# Patient Record
Sex: Male | Born: 1970 | Race: Black or African American | Hispanic: No | Marital: Single | Smoking: Current every day smoker
Health system: Southern US, Community
[De-identification: ages and names within clinical notes are randomized; demographics above are authoritative.]

---

## 2000-01-01 ENCOUNTER — Emergency Department (HOSPITAL_COMMUNITY): Admission: EM | Admit: 2000-01-01 | Discharge: 2000-01-01 | Payer: Self-pay | Admitting: *Deleted

## 2001-11-05 ENCOUNTER — Emergency Department (HOSPITAL_COMMUNITY): Admission: EM | Admit: 2001-11-05 | Discharge: 2001-11-06 | Payer: Self-pay | Admitting: Emergency Medicine

## 2001-11-06 ENCOUNTER — Encounter: Payer: Self-pay | Admitting: Emergency Medicine

## 2003-04-02 ENCOUNTER — Emergency Department (HOSPITAL_COMMUNITY): Admission: EM | Admit: 2003-04-02 | Discharge: 2003-04-02 | Payer: Self-pay | Admitting: Emergency Medicine

## 2003-08-07 ENCOUNTER — Emergency Department (HOSPITAL_COMMUNITY): Admission: EM | Admit: 2003-08-07 | Discharge: 2003-08-07 | Payer: Self-pay | Admitting: Emergency Medicine

## 2003-10-02 ENCOUNTER — Emergency Department (HOSPITAL_COMMUNITY): Admission: EM | Admit: 2003-10-02 | Discharge: 2003-10-02 | Payer: Self-pay | Admitting: Emergency Medicine

## 2005-07-29 ENCOUNTER — Emergency Department (HOSPITAL_COMMUNITY): Admission: EM | Admit: 2005-07-29 | Discharge: 2005-07-29 | Payer: Self-pay | Admitting: Emergency Medicine

## 2012-09-16 ENCOUNTER — Encounter (HOSPITAL_COMMUNITY): Payer: Self-pay | Admitting: Emergency Medicine

## 2012-09-16 ENCOUNTER — Emergency Department (HOSPITAL_COMMUNITY)
Admission: EM | Admit: 2012-09-16 | Discharge: 2012-09-16 | Disposition: A | Discharge status: Home / Self Care | Payer: Self-pay | Attending: Emergency Medicine | Admitting: Emergency Medicine

## 2012-09-16 DIAGNOSIS — L03113 Cellulitis of right upper limb: Secondary | ICD-10-CM

## 2012-09-16 DIAGNOSIS — IMO0002 Reserved for concepts with insufficient information to code with codable children: Secondary | ICD-10-CM | POA: Insufficient documentation

## 2012-09-16 DIAGNOSIS — L03114 Cellulitis of left upper limb: Secondary | ICD-10-CM

## 2012-09-16 LAB — COMPREHENSIVE METABOLIC PANEL
ALT: 7 U/L (ref 0–53)
AST: 21 U/L (ref 0–37)
Albumin: 3.9 g/dL (ref 3.5–5.2)
Alkaline Phosphatase: 89 U/L (ref 39–117)
BUN: 17 mg/dL (ref 6–23)
CO2: 29 mEq/L (ref 19–32)
Calcium: 9.1 mg/dL (ref 8.4–10.5)
Chloride: 101 mEq/L (ref 96–112)
Creatinine, Ser: 1.26 mg/dL (ref 0.50–1.35)
GFR calc Af Amer: 81 mL/min — ABNORMAL LOW (ref >90)
GFR calc non Af Amer: 70 mL/min — ABNORMAL LOW (ref >90)
Glucose, Bld: 98 mg/dL (ref 70–99)
Potassium: 4.2 mEq/L (ref 3.5–5.1)
Sodium: 136 mEq/L (ref 135–145)
Total Bilirubin: 0.8 mg/dL (ref 0.3–1.2)
Total Protein: 7.5 g/dL (ref 6.0–8.3)

## 2012-09-16 LAB — CBC WITH DIFFERENTIAL/PLATELET
Basophils Absolute: 0 10*3/uL (ref 0.0–0.1)
Basophils Relative: 0 % (ref 0–1)
Eosinophils Absolute: 0.3 10*3/uL (ref 0.0–0.7)
Eosinophils Relative: 5 % (ref 0–5)
HCT: 36.5 % — ABNORMAL LOW (ref 39.0–52.0)
Hemoglobin: 12.7 g/dL — ABNORMAL LOW (ref 13.0–17.0)
Lymphocytes Relative: 27 % (ref 12–46)
Lymphs Abs: 1.7 10*3/uL (ref 0.7–4.0)
MCH: 29.2 pg (ref 26.0–34.0)
MCHC: 34.8 g/dL (ref 30.0–36.0)
MCV: 83.9 fL (ref 78.0–100.0)
Monocytes Absolute: 0.5 10*3/uL (ref 0.1–1.0)
Monocytes Relative: 8 % (ref 3–12)
Neutro Abs: 3.7 10*3/uL (ref 1.7–7.7)
Neutrophils Relative %: 60 % (ref 43–77)
Platelets: 206 10*3/uL (ref 150–400)
RBC: 4.35 MIL/uL (ref 4.22–5.81)
RDW: 14.7 % (ref 11.5–15.5)
WBC: 6.2 10*3/uL (ref 4.0–10.5)

## 2012-09-16 MED ORDER — CLINDAMYCIN HCL 150 MG PO CAPS: 300.0000 mg | ORAL_CAPSULE | Freq: Three times a day (TID) | ORAL | Status: DC

## 2012-09-16 MED ORDER — CLINDAMYCIN PHOSPHATE 900 MG/50ML IV SOLN
900.0000 mg | Freq: Once | INTRAVENOUS | Status: AC
  Administered 2012-09-16: 900 mg via INTRAVENOUS
  Filled 2012-09-16: qty 50

## 2012-09-16 MED ORDER — CIPROFLOXACIN IN D5W 400 MG/200ML IV SOLN
400.0000 mg | Freq: Once | INTRAVENOUS | Status: AC
  Administered 2012-09-16: 400 mg via INTRAVENOUS
  Filled 2012-09-16: qty 200

## 2012-09-16 MED ORDER — SODIUM CHLORIDE 0.9 % IV BOLUS (SEPSIS)
1000.0000 mL | Freq: Once | INTRAVENOUS | Status: AC
  Administered 2012-09-16: 1000 mL via INTRAVENOUS

## 2012-09-16 MED ORDER — CIPROFLOXACIN HCL 500 MG PO TABS: 500.0000 mg | ORAL_TABLET | Freq: Two times a day (BID) | ORAL | Status: DC

## 2012-09-16 NOTE — ED Notes (Signed)
Pt with multiple red swollen areas to bilateral arms with swollen draining wounds and generalized redness with some streaking; pt sts thinks from bug bites that initially were itching

## 2012-09-16 NOTE — ED Provider Notes (Signed)
History     CSN: [id number]  Arrival date & time 09/16/12  1543   First MD Initiated Contact with Patient 09/16/12 1633      Chief Complaint  Patient presents with  . Cellulitis     HPI  The patient presents with concern of multiple areas of erythema and pain was discharged on both arms. In 2 days ago, the patient awoke the day following doing yardwork.  He notes initially he had scattered, minimally raised punctate lesions.  Over the interval the lesions have enlarged, been productive of of clear fluid, and developed erythema that is spreading.  He denies concurrent fevers, nausea, vomiting, dyspnea, chest pain, other complaints. Minimal relief with topical hydrocortisone.  the patient takes no medication, is generally well.   History reviewed. No pertinent past medical history.  History reviewed. No pertinent past surgical history.  History reviewed. No pertinent family history.  History  Substance Use Topics  . Smoking status: Never Smoker   . Smokeless tobacco: Not on file  . Alcohol Use: Yes     Comment: occ      Review of Systems  All other systems reviewed and are negative.    Allergies  Review of patient's allergies indicates no known allergies.  Home Medications   Current Outpatient Rx  Name  Route  Sig  Dispense  Refill  . hydrocortisone 2.5 % cream   Topical   Apply 1 application topically 2 (two) times daily as needed (for itching).           BP 129/74  Pulse 82  Temp(Src) 98.4 F (36.9 C) (Oral)  Resp 18  SpO2 98%  Physical Exam  Nursing note and vitals reviewed. Constitutional: He is oriented to person, place, and time. He appears well-developed. No distress.  HENT:  Head: Normocephalic and atraumatic.  Eyes: Conjunctivae and EOM are normal.  Cardiovascular: Normal rate and regular rhythm.   Pulmonary/Chest: Effort normal. No stridor. No respiratory distress.  Abdominal: He exhibits no distension.  Musculoskeletal: He exhibits no  edema.  Neurological: He is alert and oriented to person, place, and time.  Skin: Skin is warm and dry.  On each arm there are numerous mildly raised areas of ulcerated lesion with clear drainage, surrounding erythema, confluent in both forearms.  In the proximal arms there is small erythema, primarily about the medial aspect of the right upper arm.   Psychiatric: He has a normal mood and affect.    ED Course  Procedures (including critical care time)  Labs Reviewed  CBC WITH DIFFERENTIAL - Abnormal; Notable for the following:    Hemoglobin 12.7 (*)    HCT 36.5 (*)    All other components within normal limits  COMPREHENSIVE METABOLIC PANEL   No results found.   No diagnosis found.  The patient prefers a trial of IV antibiotics, observation, discharged to follow up with her primary care physician if he remains well.   7:24 PM Wounds appear better, with decreasing erythema in confluence. MDM  This patient presents with grossly infected bilateral arm wounds concerning for cellulitis.  On exam the patient is afebrile, otherwise in no distress, and generally healthy. After initial provision of antibiotics, the patient's wound improved, and he was observed here for several hours to ensure improvement.  Patient preferred discharge, do to work obligations.  This was accommodated given his capacity to make this request, the improvement following IV antibiotics, and absence of distress or abnormal vital signs.  Explicit return precautions provided.  Gerhard Munch, MD 09/16/12 1925

## 2013-01-07 ENCOUNTER — Encounter (HOSPITAL_COMMUNITY): Payer: Self-pay | Admitting: Emergency Medicine

## 2013-01-07 ENCOUNTER — Emergency Department (HOSPITAL_COMMUNITY)
Admission: EM | Admit: 2013-01-07 | Discharge: 2013-01-07 | Disposition: A | Discharge status: Home / Self Care | Payer: Self-pay | Attending: Emergency Medicine | Admitting: Emergency Medicine

## 2013-01-07 DIAGNOSIS — R599 Enlarged lymph nodes, unspecified: Secondary | ICD-10-CM | POA: Insufficient documentation

## 2013-01-07 DIAGNOSIS — R590 Localized enlarged lymph nodes: Secondary | ICD-10-CM

## 2013-01-07 DIAGNOSIS — R109 Unspecified abdominal pain: Secondary | ICD-10-CM | POA: Insufficient documentation

## 2013-01-07 DIAGNOSIS — R1909 Other intra-abdominal and pelvic swelling, mass and lump: Secondary | ICD-10-CM | POA: Insufficient documentation

## 2013-01-07 MED ORDER — IBUPROFEN 600 MG PO TABS: 600.0000 mg | ORAL_TABLET | Freq: Three times a day (TID) | ORAL | Status: DC | PRN

## 2013-01-07 NOTE — ED Notes (Signed)
Pt sts gland in left groin area swollen like was when had abscess to arm; pt poor historian; pt sts treated with antibiotics and sts does not feel right

## 2013-01-07 NOTE — ED Notes (Signed)
Patient states that he has "been having bumps pop up and then they go away.   I feel just like I did when I had cellulitis.   I might need to get antibiotics again."

## 2013-01-07 NOTE — ED Provider Notes (Signed)
CSN: [id number]     Arrival date & time 01/07/13  1452 History  This chart was scribed for Felicie Morn, NP, working with Candyce Churn, MD by Blanchard Kelch, ED Scribe. This patient was seen in room TR06C/TR06C and the patient's care was started at 3:39 PM.    Chief Complaint  Patient presents with  . Lymphadenopathy    Patient is a 42 y.o. male presenting with general illness. The history is provided by the patient. No language interpreter was used.  Illness Location:  Left inguinal area Quality:  Swelling Severity:  Mild Onset quality:  Gradual Duration:  3 weeks Timing:  Intermittent Progression:  Worsening Chronicity:  New Associated symptoms: abdominal pain   Associated symptoms: no fever and no nausea     HPI Comments: Bruce Steele is a 42 y.o. male who presents to the Emergency Department complaining of constant, gradually worsening swelling of left inguinal area that began a few weeks ago. Patient complains of assocatied intermittent, gradually worsening abdominal cramping as well as non-draining bumps to arms and right upper lateral thigh. Patient denies taking any medications for the pain.  Patient denies fever, nausea wounds, testicular/penile pain, or penile drainage. Patient reports no pertinent past medical history. He is currently sexually active.   Pt denies having a PCP currently.  History  Substance Use Topics  . Smoking status: Never Smoker   . Smokeless tobacco: Not on file  . Alcohol Use: Yes     Comment: occ    Review of Systems  Constitutional: Negative for fever.  Gastrointestinal: Positive for abdominal pain. Negative for nausea.  Genitourinary: Negative for discharge, penile pain and testicular pain.  Skin: Negative for wound.  All other systems reviewed and are negative.    Allergies  Review of patient's allergies indicates no known allergies.  Home Medications  No current outpatient prescriptions on file.  Triage Vitals: BP 133/68   Pulse 73  Temp(Src) 98.5 F (36.9 C) (Oral)  Resp 16  Wt 158 lb 12.8 oz (72.031 kg)  SpO2 96%  Physical Exam  Nursing note and vitals reviewed. Constitutional: He is oriented to person, place, and time. He appears well-developed and well-nourished. No distress.  HENT:  Head: Normocephalic and atraumatic.  Eyes: EOM are normal.  Neck: Neck supple. No tracheal deviation present.  Cardiovascular: Normal rate and regular rhythm.   Pulmonary/Chest: Effort normal and breath sounds normal. No respiratory distress.  Musculoskeletal: Normal range of motion.  Lymphadenopathy:       Left: Inguinal (slight) adenopathy present.  Neurological: He is alert and oriented to person, place, and time.  Skin: Skin is warm and dry.  Psychiatric: He has a normal mood and affect. His behavior is normal.    ED Course  Procedures (including critical care time)  DIAGNOSTIC STUDIES:  Oxygen Saturation is 96% on room air, adequate by my interpretation.    COORDINATION OF CARE:  3:50 PM -Recommend anti-inflammatory medication for pain and swelling and with follow up to a PCP if symptoms worsen. Patient verbalizes understanding and agrees with treatment plan.      Labs Review Labs Reviewed - No data to display Imaging Review No results found.  Mild tenderness to left inguinal lymph node.  Small shotty nodes palpated.  No clear indication of infectious process. No wounds to legs.  No testicular pain, dysuria, penile discharge.  Area not warm or red over the nodes.  Patient without fever.  Discussed return precautions.  Resource information provided for locating  primary care. MDM   Left inguinal lymphadenopathy, no identified infectious cause.  I personally performed the services described in this documentation, which was scribed in my presence. The recorded information has been reviewed and is accurate.    Jimmye Norman, NP 01/07/13 1751

## 2013-01-07 NOTE — ED Notes (Signed)
Discharge instructions reviewed with pt. Pt verbalized understanding.   

## 2013-01-08 NOTE — ED Provider Notes (Signed)
Medical screening examination/treatment/procedure(s) were performed by non-physician practitioner and as supervising physician I was immediately available for consultation/collaboration.   Candyce Churn, MD 01/08/13 [phone]

## 2014-03-05 ENCOUNTER — Emergency Department (HOSPITAL_COMMUNITY)
Admission: EM | Admit: 2014-03-05 | Discharge: 2014-03-06 | Disposition: A | Discharge status: Home / Self Care | Payer: Self-pay | Attending: Emergency Medicine | Admitting: Emergency Medicine

## 2014-03-05 ENCOUNTER — Encounter (HOSPITAL_COMMUNITY): Payer: Self-pay | Admitting: Emergency Medicine

## 2014-03-05 DIAGNOSIS — R197 Diarrhea, unspecified: Secondary | ICD-10-CM | POA: Insufficient documentation

## 2014-03-05 DIAGNOSIS — R109 Unspecified abdominal pain: Secondary | ICD-10-CM | POA: Insufficient documentation

## 2014-03-05 DIAGNOSIS — Z7982 Long term (current) use of aspirin: Secondary | ICD-10-CM | POA: Insufficient documentation

## 2014-03-05 LAB — COMPREHENSIVE METABOLIC PANEL
ALT: 11 U/L (ref 0–53)
AST: 18 U/L (ref 0–37)
Albumin: 3.9 g/dL (ref 3.5–5.2)
Alkaline Phosphatase: 108 U/L (ref 39–117)
Anion gap: 12 (ref 5–15)
BUN: 11 mg/dL (ref 6–23)
CO2: 25 mEq/L (ref 19–32)
Calcium: 9.5 mg/dL (ref 8.4–10.5)
Chloride: 101 mEq/L (ref 96–112)
Creatinine, Ser: 1.03 mg/dL (ref 0.50–1.35)
GFR calc Af Amer: >90 mL/min (ref >90)
GFR calc non Af Amer: 89 mL/min — ABNORMAL LOW (ref >90)
Glucose, Bld: 77 mg/dL (ref 70–99)
Potassium: 4.1 mEq/L (ref 3.7–5.3)
Sodium: 138 mEq/L (ref 137–147)
Total Bilirubin: 0.4 mg/dL (ref 0.3–1.2)
Total Protein: 7.9 g/dL (ref 6.0–8.3)

## 2014-03-05 LAB — CBC WITH DIFFERENTIAL/PLATELET
Basophils Absolute: 0 10*3/uL (ref 0.0–0.1)
Basophils Relative: 0 % (ref 0–1)
Eosinophils Absolute: 0.3 10*3/uL (ref 0.0–0.7)
Eosinophils Relative: 7 % — ABNORMAL HIGH (ref 0–5)
HCT: 37.3 % — ABNORMAL LOW (ref 39.0–52.0)
Hemoglobin: 12.8 g/dL — ABNORMAL LOW (ref 13.0–17.0)
Lymphocytes Relative: 55 % — ABNORMAL HIGH (ref 12–46)
Lymphs Abs: 2.9 10*3/uL (ref 0.7–4.0)
MCH: 28.7 pg (ref 26.0–34.0)
MCHC: 34.3 g/dL (ref 30.0–36.0)
MCV: 83.6 fL (ref 78.0–100.0)
Monocytes Absolute: 0.6 10*3/uL (ref 0.1–1.0)
Monocytes Relative: 12 % (ref 3–12)
Neutro Abs: 1.4 10*3/uL — ABNORMAL LOW (ref 1.7–7.7)
Neutrophils Relative %: 26 % — ABNORMAL LOW (ref 43–77)
Platelets: 214 10*3/uL (ref 150–400)
RBC: 4.46 MIL/uL (ref 4.22–5.81)
RDW: 14.8 % (ref 11.5–15.5)
WBC: 5.2 10*3/uL (ref 4.0–10.5)

## 2014-03-05 LAB — LIPASE, BLOOD: Lipase: 48 U/L (ref 11–59)

## 2014-03-05 NOTE — ED Notes (Signed)
Pt. reports intermittent pain across abdomen and left lower back pain for several weeks , denies nausea or vomitting , no urinary discomfort , occasional diarrhea.

## 2014-03-06 LAB — URINALYSIS, ROUTINE W REFLEX MICROSCOPIC
Bilirubin Urine: NEGATIVE
Glucose, UA: NEGATIVE mg/dL
Hgb urine dipstick: NEGATIVE
Ketones, ur: NEGATIVE mg/dL
Leukocytes, UA: NEGATIVE
Nitrite: NEGATIVE
Protein, ur: NEGATIVE mg/dL
Specific Gravity, Urine: 1.005 (ref 1.005–1.030)
Urobilinogen, UA: 0.2 mg/dL (ref 0.0–1.0)
pH: 6 (ref 5.0–8.0)

## 2014-03-06 NOTE — ED Provider Notes (Signed)
CSN: [id number]     Arrival date & time 03/05/14  2020 History   First MD Initiated Contact with Patient 03/05/14 2323     Chief Complaint  Patient presents with  . Abdominal Pain     (Consider location/radiation/quality/duration/timing/severity/associated sxs/prior Treatment) HPI Comments: 43 year-old male with no significant medical or surgery history presents with intermittent lower abdominal pain and flank pain past 2 weeks. Mild diarrhea no blood. Currently no abdominal pain. No fevers chills or vomiting. Patient recently had the death of his brother and family has been stressed coping with significant stress.  Patient is a 43 y.o. male presenting with abdominal pain. The history is provided by the patient.  Abdominal Pain Associated symptoms: diarrhea   Associated symptoms: no chest pain, no chills, no dysuria, no fever, no nausea, no shortness of breath and no vomiting     History reviewed. No pertinent past medical history. History reviewed. No pertinent past surgical history. No family history on file. History  Substance Use Topics  . Smoking status: Never Smoker   . Smokeless tobacco: Not on file  . Alcohol Use: Yes     Comment: occ    Review of Systems  Constitutional: Negative for fever and chills.  HENT: Negative for congestion.   Eyes: Negative for visual disturbance.  Respiratory: Negative for shortness of breath.   Cardiovascular: Negative for chest pain.  Gastrointestinal: Positive for abdominal pain and diarrhea. Negative for nausea and vomiting.  Genitourinary: Positive for flank pain (intermittent). Negative for dysuria.  Musculoskeletal: Negative for back pain, neck pain and neck stiffness.  Skin: Negative for rash.  Neurological: Negative for light-headedness and headaches.      Allergies  Review of patient's allergies indicates no known allergies.  Home Medications   Prior to Admission medications   Medication Sig Start Date End Date Taking?  Authorizing Provider  aspirin 325 MG tablet Take 325 mg by mouth once.   Yes Historical Provider, MD   BP 109/79  Pulse 62  Temp(Src) 98.5 F (36.9 C) (Oral)  Resp 16  Ht 5\' 9"  (1.753 m)  Wt 158 lb (71.668 kg)  BMI 23.32 kg/m2  SpO2 100% Physical Exam  Nursing note and vitals reviewed. Constitutional: He is oriented to person, place, and time. He appears well-developed and well-nourished.  HENT:  Head: Normocephalic and atraumatic.  Eyes: Conjunctivae are normal. Right eye exhibits no discharge. Left eye exhibits no discharge.  Neck: Normal range of motion. Neck supple. No tracheal deviation present.  Cardiovascular: Normal rate and regular rhythm.   Pulmonary/Chest: Effort normal and breath sounds normal.  Abdominal: Soft. He exhibits no distension. There is no tenderness. There is no guarding.  Musculoskeletal: He exhibits no edema.  Neurological: He is alert and oriented to person, place, and time.  Skin: Skin is warm. No rash noted.  Psychiatric: He has a normal mood and affect.    ED Course  Procedures (including critical care time) Labs Review Labs Reviewed  CBC WITH DIFFERENTIAL - Abnormal; Notable for the following:    Hemoglobin 12.8 (*)    HCT 37.3 (*)    Neutrophils Relative % 26 (*)    Neutro Abs 1.4 (*)    Lymphocytes Relative 55 (*)    Eosinophils Relative 7 (*)    All other components within normal limits  COMPREHENSIVE METABOLIC PANEL - Abnormal; Notable for the following:    GFR calc non Af Amer 89 (*)    All other components within normal limits  LIPASE, BLOOD  URINALYSIS, ROUTINE W REFLEX MICROSCOPIC    Imaging Review No results found.   EKG Interpretation None      MDM   Final diagnoses:  Diarrhea  Abdominal cramping   Patient with no significant medical history, currently no abdominal or flank pain. Discussed monitoring symptoms closely, improving diet and watching if symptoms improve as family stress improves. Benign abdominal exam.  Urinalysis pending. No indication for CT scan this time.  Results and differential diagnosis were discussed with the patient/parent/guardian. Close follow up outpatient was discussed, comfortable with the plan.   Medications - No data to display  Filed Vitals:   03/05/14 2055 03/05/14 2250 03/05/14 2337  BP: 124/78 113/60 109/79  Pulse: 62 55 62  Temp: 98.5 F (36.9 C) 98.5 F (36.9 C)   TempSrc: Oral Oral   Resp: 14 16 16   Height: 5\' 9"  (1.753 m)    Weight: 158 lb (71.668 kg)    SpO2: 100% 99% 100%    Final diagnoses:  Diarrhea  Abdominal cramping        Enid SkeensJoshua M Daliyah Sramek, MD 03/06/14 [phone]

## 2014-03-06 NOTE — Discharge Instructions (Signed)
If you were given medicines take as directed.  If you are on coumadin or contraceptives realize their levels and effectiveness is altered by many different medicines.  If you have any reaction (rash, tongues swelling, other) to the medicines stop taking and see a physician.   Please follow up as directed and return to the ER or see a physician for new or worsening symptoms.  Thank you. Filed Vitals:   03/05/14 2055 03/05/14 2250 03/05/14 2337  BP: 124/78 113/60 109/79  Pulse: 62 55 62  Temp: 98.5 F (36.9 C) 98.5 F (36.9 C)   TempSrc: Oral Oral   Resp: 14 16 16   Height: 5\' 9"  (1.753 m)    Weight: 158 lb (71.668 kg)    SpO2: 100% 99% 100%

## 2014-03-25 ENCOUNTER — Encounter (HOSPITAL_COMMUNITY): Payer: Self-pay | Admitting: Cardiology

## 2014-03-25 ENCOUNTER — Emergency Department (HOSPITAL_COMMUNITY)
Admission: EM | Admit: 2014-03-25 | Discharge: 2014-03-25 | Disposition: A | Discharge status: Home / Self Care | Payer: Self-pay | Attending: Emergency Medicine | Admitting: Emergency Medicine

## 2014-03-25 DIAGNOSIS — R51 Headache: Secondary | ICD-10-CM | POA: Insufficient documentation

## 2014-03-25 DIAGNOSIS — R519 Headache, unspecified: Secondary | ICD-10-CM

## 2014-03-25 DIAGNOSIS — Z720 Tobacco use: Secondary | ICD-10-CM | POA: Insufficient documentation

## 2014-03-25 DIAGNOSIS — R197 Diarrhea, unspecified: Secondary | ICD-10-CM | POA: Insufficient documentation

## 2014-03-25 DIAGNOSIS — R109 Unspecified abdominal pain: Secondary | ICD-10-CM | POA: Insufficient documentation

## 2014-03-25 LAB — CBC WITH DIFFERENTIAL/PLATELET
Basophils Absolute: 0 10*3/uL (ref 0.0–0.1)
Basophils Relative: 0 % (ref 0–1)
Eosinophils Absolute: 0.2 10*3/uL (ref 0.0–0.7)
Eosinophils Relative: 5 % (ref 0–5)
HCT: 39.6 % (ref 39.0–52.0)
Hemoglobin: 13.2 g/dL (ref 13.0–17.0)
Lymphocytes Relative: 51 % — ABNORMAL HIGH (ref 12–46)
Lymphs Abs: 2.1 10*3/uL (ref 0.7–4.0)
MCH: 29.1 pg (ref 26.0–34.0)
MCHC: 33.3 g/dL (ref 30.0–36.0)
MCV: 87.2 fL (ref 78.0–100.0)
Monocytes Absolute: 0.5 10*3/uL (ref 0.1–1.0)
Monocytes Relative: 11 % (ref 3–12)
Neutro Abs: 1.4 10*3/uL — ABNORMAL LOW (ref 1.7–7.7)
Neutrophils Relative %: 33 % — ABNORMAL LOW (ref 43–77)
Platelets: 232 10*3/uL (ref 150–400)
RBC: 4.54 MIL/uL (ref 4.22–5.81)
RDW: 15.7 % — ABNORMAL HIGH (ref 11.5–15.5)
WBC: 4.2 10*3/uL (ref 4.0–10.5)

## 2014-03-25 LAB — COMPREHENSIVE METABOLIC PANEL
ALT: 13 U/L (ref 0–53)
AST: 22 U/L (ref 0–37)
Albumin: 4 g/dL (ref 3.5–5.2)
Alkaline Phosphatase: 102 U/L (ref 39–117)
Anion gap: 13 (ref 5–15)
BUN: 10 mg/dL (ref 6–23)
CO2: 24 mEq/L (ref 19–32)
Calcium: 9.3 mg/dL (ref 8.4–10.5)
Chloride: 103 mEq/L (ref 96–112)
Creatinine, Ser: 1.08 mg/dL (ref 0.50–1.35)
GFR calc Af Amer: >90 mL/min (ref >90)
GFR calc non Af Amer: 84 mL/min — ABNORMAL LOW (ref >90)
Glucose, Bld: 93 mg/dL (ref 70–99)
Potassium: 4.2 mEq/L (ref 3.7–5.3)
Sodium: 140 mEq/L (ref 137–147)
Total Bilirubin: 1 mg/dL (ref 0.3–1.2)
Total Protein: 7.7 g/dL (ref 6.0–8.3)

## 2014-03-25 LAB — URINALYSIS, ROUTINE W REFLEX MICROSCOPIC
Bilirubin Urine: NEGATIVE
Glucose, UA: NEGATIVE mg/dL
Hgb urine dipstick: NEGATIVE
Ketones, ur: NEGATIVE mg/dL
Leukocytes, UA: NEGATIVE
Nitrite: NEGATIVE
Protein, ur: NEGATIVE mg/dL
Specific Gravity, Urine: 1.022 (ref 1.005–1.030)
Urobilinogen, UA: 1 mg/dL (ref 0.0–1.0)
pH: 6 (ref 5.0–8.0)

## 2014-03-25 LAB — LIPASE, BLOOD: Lipase: 34 U/L (ref 11–59)

## 2014-03-25 NOTE — ED Provider Notes (Signed)
CSN: [id number]     Arrival date & time 03/25/14  1136 History   First MD Initiated Contact with Patient 03/25/14 1505     Chief Complaint  Patient presents with  . Abdominal Pain  . Headache     (Consider location/radiation/quality/duration/timing/severity/associated sxs/prior Treatment) HPI Comments: This is a 43 y/o male with no significant past medical history who presents to the emergency department requesting the number that he was given on his discharge papers when he was seen on October 26. It is noted he was referred to the wellness clinic, however no longer has the paperwork and cannot find the number. He states he was seen for abdominal pain at that time, which has been coming and going since. Pain located along his left side, intermittent, nothing in specific making the pain come or go. Pain is cramping and nonradiating. Admits to a few episodes of nonbloody diarrhea. No nausea or vomiting. He also reports over the past 2 weeks he has been experiencing generalized headaches that are relieved by Tylenol. Currently he is pain-free. Denies fever, chills, neck pain, vision changes, nausea, vomiting, photophobia or phonophobia. He states prior to onset of headaches, his brother passed away, and since then he has been experiencing very vivid dreams. He states he is experiencing very real life dreams and then getting dja vu. Denies auditory or visual hallucinations.  Patient is a 43 y.o. male presenting with abdominal pain and headaches. The history is provided by the patient.  Abdominal Pain Associated symptoms: diarrhea   Headache Associated symptoms: abdominal pain and diarrhea     History reviewed. No pertinent past medical history. History reviewed. No pertinent past surgical history. History reviewed. No pertinent family history. History  Substance Use Topics  . Smoking status: Current Every Day Smoker    Types: Cigars  . Smokeless tobacco: Not on file  . Alcohol Use: Yes    Comment: occ    Review of Systems  Gastrointestinal: Positive for abdominal pain and diarrhea.  Neurological: Positive for headaches.  All other systems reviewed and are negative.     Allergies  Review of patient's allergies indicates no known allergies.  Home Medications   Prior to Admission medications   Medication Sig Start Date End Date Taking? Authorizing Provider  aspirin 325 MG tablet Take 325 mg by mouth once.    Historical Provider, MD   BP 114/65 mmHg  Pulse 55  Temp(Src) 97.9 F (36.6 C) (Oral)  Resp 16  SpO2 100% Physical Exam  Constitutional: He is oriented to person, place, and time. He appears well-developed and well-nourished. No distress.  HENT:  Head: Normocephalic and atraumatic.  Mouth/Throat: Oropharynx is clear and moist.  Eyes: Conjunctivae and EOM are normal. Pupils are equal, round, and reactive to light.  Neck: Normal range of motion. Neck supple. No JVD present.  Cardiovascular: Normal rate, regular rhythm, normal heart sounds and intact distal pulses.   No extremity edema.  Pulmonary/Chest: Effort normal and breath sounds normal. No respiratory distress.  Abdominal: Soft. Bowel sounds are normal. He exhibits no distension and no mass. There is no tenderness. There is no rebound and no guarding.  Musculoskeletal: Normal range of motion. He exhibits no edema.  Neurological: He is alert and oriented to person, place, and time. He has normal strength. No cranial nerve deficit or sensory deficit. He displays a negative Romberg sign. Coordination normal.  Speech fluent, goal oriented. Moves limbs without ataxia. Equal grip strength bilateral.  Skin: Skin is warm and  dry. He is not diaphoretic.  Psychiatric: He has a normal mood and affect. His behavior is normal.  Nursing note and vitals reviewed.   ED Course  Procedures (including critical care time) Labs Review Labs Reviewed  CBC WITH DIFFERENTIAL - Abnormal; Notable for the following:     RDW 15.7 (*)    Neutrophils Relative % 33 (*)    Neutro Abs 1.4 (*)    Lymphocytes Relative 51 (*)    All other components within normal limits  COMPREHENSIVE METABOLIC PANEL - Abnormal; Notable for the following:    GFR calc non Af Amer 84 (*)    All other components within normal limits  LIPASE, BLOOD  URINALYSIS, ROUTINE W REFLEX MICROSCOPIC    Imaging Review No results found.   EKG Interpretation None      MDM   Final diagnoses:  Left sided abdominal pain  Generalized headaches   Patient requesting the number that he was given at prior visit. He is in no apparent distress. Asymptomatic in the emergency department. Afebrile, vital signs stable. Abdomen is soft and nontender. Unremarkable neurologic exam. No hallucinations. His vivid dreams may be an aspect of the stress reaction after his brother passed away. Patient will follow-up with the wellness clinic. He is stable for discharge. Return precautions given. Patient states understanding of treatment care plan and is agreeable.  Kathrynn SpeedRobyn M Darryel Diodato, PA-C 03/25/14 1552  Audree CamelScott T Goldston, MD 03/26/14 [id number]

## 2014-03-25 NOTE — ED Notes (Signed)
Patient states he sees things before they happen. Patient states, " Its like I could see yalls names on the board and I could see the guy getting my blood'.

## 2014-03-25 NOTE — ED Notes (Signed)
Pt reports he has had abd pain for a couple of weeks and was seen for that here, but pain has continued. Reports he is also having headaches and pain between his testicles and rectum.

## 2014-03-25 NOTE — Discharge Instructions (Signed)
Headaches, Frequently Asked Questions °MIGRAINE HEADACHES °Q: What is migraine? What causes it? How can I treat it? °A: Generally, migraine headaches begin as a dull ache. Then they develop into a constant, throbbing, and pulsating pain. You may experience pain at the temples. You may experience pain at the front or back of one or both sides of the head. The pain is usually accompanied by a combination of: °· Nausea. °· Vomiting. °· Sensitivity to light and noise. °Some people (about 15%) experience an aura (see below) before an attack. The cause of migraine is believed to be chemical reactions in the brain. Treatment for migraine may include over-the-counter or prescription medications. It may also include self-help techniques. These include relaxation training and biofeedback.  °Q: What is an aura? °A: About 15% of people with migraine get an "aura". This is a sign of neurological symptoms that occur before a migraine headache. You may see wavy or jagged lines, dots, or flashing lights. You might experience tunnel vision or blind spots in one or both eyes. The aura can include visual or auditory hallucinations (something imagined). It may include disruptions in smell (such as strange odors), taste or touch. Other symptoms include: °· Numbness. °· A "pins and needles" sensation. °· Difficulty in recalling or speaking the correct word. °These neurological events may last as long as 60 minutes. These symptoms will fade as the headache begins. °Q: What is a trigger? °A: Certain physical or environmental factors can lead to or "trigger" a migraine. These include: °· Foods. °· Hormonal changes. °· Weather. °· Stress. °It is important to remember that triggers are different for everyone. To help prevent migraine attacks, you need to figure out which triggers affect you. Keep a headache diary. This is a good way to track triggers. The diary will help you talk to your healthcare professional about your condition. °Q: Does  weather affect migraines? °A: Bright sunshine, hot, humid conditions, and drastic changes in barometric pressure may lead to, or "trigger," a migraine attack in some people. But studies have shown that weather does not act as a trigger for everyone with migraines. °Q: What is the link between migraine and hormones? °A: Hormones start and regulate many of your body's functions. Hormones keep your body in balance within a constantly changing environment. The levels of hormones in your body are unbalanced at times. Examples are during menstruation, pregnancy, or menopause. That can lead to a migraine attack. In fact, about three quarters of all women with migraine report that their attacks are related to the menstrual cycle.  °Q: Is there an increased risk of stroke for migraine sufferers? °A: The likelihood of a migraine attack causing a stroke is very remote. That is not to say that migraine sufferers cannot have a stroke associated with their migraines. In persons under age 40, the most common associated factor for stroke is migraine headache. But over the course of a person's normal life span, the occurrence of migraine headache may actually be associated with a reduced risk of dying from cerebrovascular disease due to stroke.  °Q: What are acute medications for migraine? °A: Acute medications are used to treat the pain of the headache after it has started. Examples over-the-counter medications, NSAIDs, ergots, and triptans.  °Q: What are the triptans? °A: Triptans are the newest class of abortive medications. They are specifically targeted to treat migraine. Triptans are vasoconstrictors. They moderate some chemical reactions in the brain. The triptans work on receptors in your brain. Triptans help   to restore the balance of a neurotransmitter called serotonin. Fluctuations in levels of serotonin are thought to be a main cause of migraine.  °Q: Are over-the-counter medications for migraine effective? °A:  Over-the-counter, or "OTC," medications may be effective in relieving mild to moderate pain and associated symptoms of migraine. But you should see your caregiver before beginning any treatment regimen for migraine.  °Q: What are preventive medications for migraine? °A: Preventive medications for migraine are sometimes referred to as "prophylactic" treatments. They are used to reduce the frequency, severity, and length of migraine attacks. Examples of preventive medications include antiepileptic medications, antidepressants, beta-blockers, calcium channel blockers, and NSAIDs (nonsteroidal anti-inflammatory drugs). °Q: Why are anticonvulsants used to treat migraine? °A: During the past few years, there has been an increased interest in antiepileptic drugs for the prevention of migraine. They are sometimes referred to as "anticonvulsants". Both epilepsy and migraine may be caused by similar reactions in the brain.  °Q: Why are antidepressants used to treat migraine? °A: Antidepressants are typically used to treat people with depression. They may reduce migraine frequency by regulating chemical levels, such as serotonin, in the brain.  °Q: What alternative therapies are used to treat migraine? °A: The term "alternative therapies" is often used to describe treatments considered outside the scope of conventional Western medicine. Examples of alternative therapy include acupuncture, acupressure, and yoga. Another common alternative treatment is herbal therapy. Some herbs are believed to relieve headache pain. Always discuss alternative therapies with your caregiver before proceeding. Some herbal products contain arsenic and other toxins. °TENSION HEADACHES °Q: What is a tension-type headache? What causes it? How can I treat it? °A: Tension-type headaches occur randomly. They are often the result of temporary stress, anxiety, fatigue, or anger. Symptoms include soreness in your temples, a tightening band-like sensation  around your head (a "vice-like" ache). Symptoms can also include a pulling feeling, pressure sensations, and contracting head and neck muscles. The headache begins in your forehead, temples, or the back of your head and neck. Treatment for tension-type headache may include over-the-counter or prescription medications. Treatment may also include self-help techniques such as relaxation training and biofeedback. °CLUSTER HEADACHES °Q: What is a cluster headache? What causes it? How can I treat it? °A: Cluster headache gets its name because the attacks come in groups. The pain arrives with little, if any, warning. It is usually on one side of the head. A tearing or bloodshot eye and a runny nose on the same side of the headache may also accompany the pain. Cluster headaches are believed to be caused by chemical reactions in the brain. They have been described as the most severe and intense of any headache type. Treatment for cluster headache includes prescription medication and oxygen. °SINUS HEADACHES °Q: What is a sinus headache? What causes it? How can I treat it? °A: When a cavity in the bones of the face and skull (a sinus) becomes inflamed, the inflammation will cause localized pain. This condition is usually the result of an allergic reaction, a tumor, or an infection. If your headache is caused by a sinus blockage, such as an infection, you will probably have a fever. An x-ray will confirm a sinus blockage. Your caregiver's treatment might include antibiotics for the infection, as well as antihistamines or decongestants.  °REBOUND HEADACHES °Q: What is a rebound headache? What causes it? How can I treat it? °A: A pattern of taking acute headache medications too often can lead to a condition known as "rebound headache."   A pattern of taking too much headache medication includes taking it more than 2 days per week or in excessive amounts. That means more than the label or a caregiver advises. With rebound  headaches, your medications not only stop relieving pain, they actually begin to cause headaches. Doctors treat rebound headache by tapering the medication that is being overused. Sometimes your caregiver will gradually substitute a different type of treatment or medication. Stopping may be a challenge. Regularly overusing a medication increases the potential for serious side effects. Consult a caregiver if you regularly use headache medications more than 2 days per week or more than the label advises. ADDITIONAL QUESTIONS AND ANSWERS Q: What is biofeedback? A: Biofeedback is a self-help treatment. Biofeedback uses special equipment to monitor your body's involuntary physical responses. Biofeedback monitors:  Breathing.  Pulse.  Heart rate.  Temperature.  Muscle tension.  Brain activity. Biofeedback helps you refine and perfect your relaxation exercises. You learn to control the physical responses that are related to stress. Once the technique has been mastered, you do not need the equipment any more. Q: Are headaches hereditary? A: Four out of five (80%) of people that suffer report a family history of migraine. Scientists are not sure if this is genetic or a family predisposition. Despite the uncertainty, a child has a 50% chance of having migraine if one parent suffers. The child has a 75% chance if both parents suffer.  Q: Can children get headaches? A: By the time they reach high school, most young people have experienced some type of headache. Many safe and effective approaches or medications can prevent a headache from occurring or stop it after it has begun.  Q: What type of doctor should I see to diagnose and treat my headache? A: Start with your primary caregiver. Discuss his or her experience and approach to headaches. Discuss methods of classification, diagnosis, and treatment. Your caregiver may decide to recommend you to a headache specialist, depending upon your symptoms or other  physical conditions. Having diabetes, allergies, etc., may require a more comprehensive and inclusive approach to your headache. The National Headache Foundation will provide, upon request, a list of [facility] physician members in your state. Document Released: 07/19/2003 Document Revised: 07/21/2011 Document Reviewed: 12/27/2007 [facility] Patient Information 2015 [place], [place]. This information is not intended to replace advice given to you by your health care provider. Make sure you discuss any questions you have with your health care provider.  Abdominal Pain Many things can cause abdominal pain. Usually, abdominal pain is not caused by a disease and will improve without treatment. It can often be observed and treated at home. Your health care provider will do a physical exam and possibly order blood tests and X-rays to help determine the seriousness of your pain. However, in many cases, more time must pass before a clear cause of the pain can be found. Before that point, your health care provider may not know if you need more testing or further treatment. HOME CARE INSTRUCTIONS  Monitor your abdominal pain for any changes. The following actions may help to alleviate any discomfort you are experiencing:  Only take over-the-counter or prescription medicines as directed by your health care provider.  Do not take laxatives unless directed to do so by your health care provider.  Try a clear liquid diet (broth, tea, or water) as directed by your health care provider. Slowly move to a bland diet as tolerated. SEEK MEDICAL CARE IF:  You have unexplained abdominal pain.  You have abdominal pain associated with nausea or diarrhea.  You have pain when you urinate or have a bowel movement.  You experience abdominal pain that wakes you in the night.  You have abdominal pain that is worsened or improved by eating food.  You have abdominal pain that is worsened with eating fatty foods.  You have a  fever. SEEK IMMEDIATE MEDICAL CARE IF:   Your pain does not go away within 2 hours.  You keep throwing up (vomiting).  Your pain is felt only in portions of the abdomen, such as the right side or the left lower portion of the abdomen.  You pass bloody or black tarry stools. MAKE SURE YOU:  Understand these instructions.   Will watch your condition.   Will get help right away if you are not doing well or get worse.  Document Released: 02/05/2005 Document Revised: 05/03/2013 Document Reviewed: 01/05/2013 [facility] Patient Information 2015 [place], [place]. This information is not intended to replace advice given to you by your health care provider. Make sure you discuss any questions you have with your health care provider.

## 2014-03-28 ENCOUNTER — Emergency Department (HOSPITAL_COMMUNITY)
Admission: EM | Admit: 2014-03-28 | Discharge: 2014-03-29 | Disposition: A | Discharge status: Home / Self Care | Payer: Self-pay | Attending: Emergency Medicine | Admitting: Emergency Medicine

## 2014-03-28 ENCOUNTER — Encounter (HOSPITAL_COMMUNITY): Payer: Self-pay | Admitting: Emergency Medicine

## 2014-03-28 DIAGNOSIS — Z720 Tobacco use: Secondary | ICD-10-CM | POA: Insufficient documentation

## 2014-03-28 DIAGNOSIS — R0789 Other chest pain: Secondary | ICD-10-CM | POA: Insufficient documentation

## 2014-03-28 DIAGNOSIS — R0602 Shortness of breath: Secondary | ICD-10-CM | POA: Insufficient documentation

## 2014-03-28 DIAGNOSIS — R079 Chest pain, unspecified: Secondary | ICD-10-CM

## 2014-03-28 LAB — CBC WITH DIFFERENTIAL/PLATELET
BASOS ABS: 0 10*3/uL (ref 0.0–0.1)
Basophils Relative: 1 % (ref 0–1)
EOS ABS: 0.4 10*3/uL (ref 0.0–0.7)
EOS PCT: 6 % — AB (ref 0–5)
HEMATOCRIT: 35.8 % — AB (ref 39.0–52.0)
Hemoglobin: 11.8 g/dL — ABNORMAL LOW (ref 13.0–17.0)
Lymphocytes Relative: 47 % — ABNORMAL HIGH (ref 12–46)
Lymphs Abs: 2.9 10*3/uL (ref 0.7–4.0)
MCH: 27.8 pg (ref 26.0–34.0)
MCHC: 33 g/dL (ref 30.0–36.0)
MCV: 84.2 fL (ref 78.0–100.0)
MONO ABS: 0.6 10*3/uL (ref 0.1–1.0)
Monocytes Relative: 10 % (ref 3–12)
Neutro Abs: 2.1 10*3/uL (ref 1.7–7.7)
Neutrophils Relative %: 36 % — ABNORMAL LOW (ref 43–77)
PLATELETS: 221 10*3/uL (ref 150–400)
RBC: 4.25 MIL/uL (ref 4.22–5.81)
RDW: 15.4 % (ref 11.5–15.5)
WBC: 5.9 10*3/uL (ref 4.0–10.5)

## 2014-03-28 LAB — BASIC METABOLIC PANEL
ANION GAP: 11 (ref 5–15)
BUN: 12 mg/dL (ref 6–23)
CALCIUM: 8.9 mg/dL (ref 8.4–10.5)
CO2: 23 mEq/L (ref 19–32)
Chloride: 105 mEq/L (ref 96–112)
Creatinine, Ser: 1.02 mg/dL (ref 0.50–1.35)
GFR calc Af Amer: >90 mL/min (ref >90)
GFR, EST NON AFRICAN AMERICAN: 90 mL/min — AB (ref >90)
Glucose, Bld: 96 mg/dL (ref 70–99)
Potassium: 3.7 mEq/L (ref 3.7–5.3)
SODIUM: 139 meq/L (ref 137–147)

## 2014-03-28 LAB — PRO B NATRIURETIC PEPTIDE: Pro B Natriuretic peptide (BNP): <5 pg/mL (ref 0–125)

## 2014-03-28 LAB — I-STAT TROPONIN, ED: Troponin i, poc: 0 ng/mL (ref 0.00–0.08)

## 2014-03-28 NOTE — ED Notes (Signed)
The patient has been having chest pain for about two weeks.    The patient said it has gotten worse and it concerned him so he decided to come in.  He denies any other symptoms other than chest pain.

## 2014-03-29 ENCOUNTER — Emergency Department (HOSPITAL_COMMUNITY): Payer: Self-pay

## 2014-03-29 ENCOUNTER — Telehealth: Payer: Self-pay | Admitting: *Deleted

## 2014-03-29 NOTE — Telephone Encounter (Signed)
NCM called pt related to consult from Ross Marcusourtney Horton, MD [facility].  NCM obtained appt via Clydie BraunKaren for 12/2 @ 0945.  NCM called pt with appt information.  Pt very appreciative and verbalizes importance of keeping appointment.

## 2014-03-29 NOTE — ED Provider Notes (Signed)
CSN: [id number]     Arrival date & time 03/28/14  2236 History   First MD Initiated Contact with Patient 03/29/14 0003     Chief Complaint  Patient presents with  . Chest Pain    The patient has been having chest pain for about two weeks.       (Consider location/radiation/quality/duration/timing/severity/associated sxs/prior Treatment) HPI  This is a 43 year old male who is a current smoker who presents with chest pain. Patient reports 2 week history of intermittent chest pain. He reports that at the sternal and initially was "tingly feeling" and has become more pressure-like. It lasts for approximately 30 seconds. It is not associated with exertion. It is associated with shortness of breath. No diaphoresis. Denies any history of hypertension, hyperlipidemia, diabetes, or early family history of heart disease. Patient denies any recent leg swelling, blood clot history, hospitalization, recent travel. Currently he is chest pain-free.  History reviewed. No pertinent past medical history. History reviewed. No pertinent past surgical history. History reviewed. No pertinent family history. History  Substance Use Topics  . Smoking status: Current Every Day Smoker    Types: Cigars  . Smokeless tobacco: Not on file  . Alcohol Use: Yes     Comment: occ    Review of Systems  Constitutional: Negative.  Negative for fever.  Respiratory: Positive for chest tightness and shortness of breath. Negative for wheezing.   Cardiovascular: Positive for chest pain. Negative for leg swelling.  Gastrointestinal: Negative.  Negative for nausea, vomiting and abdominal pain.  Genitourinary: Negative.  Negative for dysuria.  Musculoskeletal: Negative for back pain.  All other systems reviewed and are negative.     Allergies  Review of patient's allergies indicates no known allergies.  Home Medications   Prior to Admission medications   Medication Sig Start Date End Date Taking? Authorizing Provider   acetaminophen (TYLENOL) 500 MG tablet Take 1,000 mg by mouth every 6 (six) hours as needed for mild pain.   Yes Historical Provider, MD  aspirin 325 MG tablet Take 325 mg by mouth daily as needed (chest pain).    Yes Historical Provider, MD   BP 117/71 mmHg  Pulse 65  Temp(Src) 98.3 F (36.8 C)  Resp 20  Ht 5\' 8"  (1.727 m)  Wt 158 lb (71.668 kg)  BMI 24.03 kg/m2  SpO2 100% Physical Exam  Constitutional: He is oriented to person, place, and time. He appears well-developed and well-nourished.  HENT:  Head: Normocephalic and atraumatic.  Eyes: Pupils are equal, round, and reactive to light.  Neck: Neck supple.  Cardiovascular: Normal rate, regular rhythm and normal heart sounds.   No murmur heard. Pulmonary/Chest: Effort normal and breath sounds normal. No respiratory distress. He has no wheezes.  Abdominal: Soft. Bowel sounds are normal. There is no tenderness. There is no rebound.  Musculoskeletal: He exhibits no edema.  Neurological: He is alert and oriented to person, place, and time.  Skin: Skin is warm and dry.  Psychiatric: He has a normal mood and affect.  Nursing note and vitals reviewed.   ED Course  Procedures (including critical care time) Labs Review Labs Reviewed  CBC WITH DIFFERENTIAL - Abnormal; Notable for the following:    Hemoglobin 11.8 (*)    HCT 35.8 (*)    Neutrophils Relative % 36 (*)    Lymphocytes Relative 47 (*)    Eosinophils Relative 6 (*)    All other components within normal limits  BASIC METABOLIC PANEL - Abnormal; Notable for the following:  GFR calc non Af Amer 90 (*)    All other components within normal limits  PRO B NATRIURETIC PEPTIDE  I-STAT TROPOININ, ED    Imaging Review Dg Chest 2 View  03/29/2014   CLINICAL DATA:  Mid chest pain and cough  EXAM: CHEST  2 VIEW  COMPARISON:  None.  FINDINGS: The heart size and mediastinal contours are within normal limits. Both lungs are clear. The visualized skeletal structures are  unremarkable.  IMPRESSION: No active cardiopulmonary disease.   Electronically Signed   By: Alcide CleverMark  Lukens M.D.   On: 03/29/2014 00:47     EKG Interpretation   Date/Time:  Tuesday March 28 2014 22:42:22 EST Ventricular Rate:  63 PR Interval:  188 QRS Duration: 88 QT Interval:  372 QTC Calculation: 380 R Axis:   81 Text Interpretation:  Normal sinus rhythm Nonspecific ST and T wave  abnormality Abnormal ECG T wave inversions inferior and laterally  Confirmed by HORTON  MD, COURTNEY ([id number]) on 03/29/2014 12:14:17 AM      MDM   Final diagnoses:  Chest pain    Patient presents with chest pain. Reports 2 week history of chest pain. Description is very atypical for ACS. Heart score is 2. EKG reassuring. Chest x-ray without evidence of pneumothorax or pneumonia and basic lab work including troponin is negative. Patient has been chest pain-free while in the emergency department.  He is PERC neg.  Discuss with patient follow-up with cardiology for possible stress testing. Feel he is safe for discharge home. Patient reports that he [facility] had [facility]. I have placed a voicemail message with the social worker to follow-up with the patient regarding [facility]-up.  After history, exam, and medical workup I feel the patient has been appropriately medically screened and is safe for discharge home. Pertinent diagnoses were discussed with the patient. Patient was given return precautions.     Shon Batonourtney F Horton, MD 03/29/14 [phone]

## 2014-03-29 NOTE — Discharge Instructions (Signed)
You were seen today for chest pain.  Your work-up is unremarkable.  Given that you are a smoker which for acute risk for heart disease, you should follow-up with cardiology for possible stress testing.  Chest Pain (Nonspecific) It is often hard to give a specific diagnosis for the cause of chest pain. There is always a chance that your pain could be related to something serious, such as a heart attack or a blood clot in the lungs. You need to follow up with your health care provider for further evaluation. CAUSES   Heartburn.  Pneumonia or bronchitis.  Anxiety or stress.  Inflammation around your heart (pericarditis) or lung (pleuritis or pleurisy).  A blood clot in the lung.  A collapsed lung (pneumothorax). It can develop suddenly on its own (spontaneous pneumothorax) or from trauma to the chest.  Shingles infection (herpes zoster virus). The chest wall is composed of bones, muscles, and cartilage. Any of these can be the source of the pain.  The bones can be bruised by injury.  The muscles or cartilage can be strained by coughing or overwork.  The cartilage can be affected by inflammation and become sore (costochondritis). DIAGNOSIS  Lab tests or other studies may be needed to find the cause of your pain. Your health care provider may have you take a test called an ambulatory electrocardiogram (ECG). An ECG records your heartbeat patterns over a 24-hour period. You may also have other tests, such as:  Transthoracic echocardiogram (TTE). During echocardiography, sound waves are used to evaluate how blood flows through your heart.  Transesophageal echocardiogram (TEE).  Cardiac monitoring. This allows your health care provider to monitor your heart rate and rhythm in real time.  Holter monitor. This is a portable device that records your heartbeat and can help diagnose heart arrhythmias. It allows your health care provider to track your heart activity for several days, if  needed.  Stress tests by exercise or by giving medicine that makes the heart beat faster. TREATMENT   Treatment depends on what may be causing your chest pain. Treatment may include:  Acid blockers for heartburn.  Anti-inflammatory medicine.  Pain medicine for inflammatory conditions.  Antibiotics if an infection is present.  You may be advised to change lifestyle habits. This includes stopping smoking and avoiding alcohol, caffeine, and chocolate.  You may be advised to keep your head raised (elevated) when sleeping. This reduces the chance of acid going backward from your stomach into your esophagus. Most of the time, nonspecific chest pain will improve within 2-3 days with rest and mild pain medicine.  HOME CARE INSTRUCTIONS   If antibiotics were prescribed, take them as directed. Finish them even if you start to feel better.  For the next few days, avoid physical activities that bring on chest pain. Continue physical activities as directed.  Do not use any tobacco products, including cigarettes, chewing tobacco, or electronic cigarettes.  Avoid drinking alcohol.  Only take medicine as directed by your health care provider.  Follow your health care provider's suggestions for further testing if your chest pain does not go away.  Keep any follow-up appointments you made. If you do not go to an appointment, you could develop lasting (chronic) problems with pain. If there is any problem keeping an appointment, call to reschedule. SEEK MEDICAL CARE IF:   Your chest pain does not go away, even after treatment.  You have a rash with blisters on your chest.  You have a fever. SEEK IMMEDIATE MEDICAL  CARE IF:   You have increased chest pain or pain that spreads to your arm, neck, jaw, back, or abdomen.  You have shortness of breath.  You have an increasing cough, or you cough up blood.  You have severe back or abdominal pain.  You feel nauseous or vomit.  You have severe  weakness.  You faint.  You have chills. This is an emergency. Do not wait to see if the pain will go away. Get medical help at once. Call your local emergency services (911 in [place] Do not drive yourself to the hospital. MAKE SURE YOU:   Understand these instructions.  Will watch your condition.  Will get help right away if you are not doing well or get worse. Document Released: 02/05/2005 Document Revised: 05/03/2013 Document Reviewed: 12/02/2007 [facility] Patient Information 2015 [place], [place]. This information is not intended to replace advice given to you by your health care provider. Make sure you discuss any questions you have with your health care provider.

## 2014-04-12 ENCOUNTER — Ambulatory Visit: Payer: Self-pay | Attending: Cardiology | Admitting: Cardiology

## 2014-04-12 ENCOUNTER — Encounter: Payer: Self-pay | Admitting: Cardiology

## 2014-04-12 VITALS — BP 131/85 | HR 66 | Temp 98.2°F | Resp 20 | Ht 68.0 in | Wt 158.0 lb

## 2014-04-12 DIAGNOSIS — F17210 Nicotine dependence, cigarettes, uncomplicated: Secondary | ICD-10-CM | POA: Insufficient documentation

## 2014-04-12 DIAGNOSIS — R350 Frequency of micturition: Secondary | ICD-10-CM

## 2014-04-12 DIAGNOSIS — R079 Chest pain, unspecified: Secondary | ICD-10-CM | POA: Insufficient documentation

## 2014-04-12 DIAGNOSIS — Z8042 Family history of malignant neoplasm of prostate: Secondary | ICD-10-CM | POA: Insufficient documentation

## 2014-04-12 DIAGNOSIS — F1290 Cannabis use, unspecified, uncomplicated: Secondary | ICD-10-CM | POA: Insufficient documentation

## 2014-04-12 DIAGNOSIS — Z7982 Long term (current) use of aspirin: Secondary | ICD-10-CM | POA: Insufficient documentation

## 2014-04-12 NOTE — Patient Instructions (Signed)
Please continuing working on smoking cessation. We will check your PSA today and inform you of the results. It was great meeting you today!  Smoking Cessation Quitting smoking is important to your health and has many advantages. However, it is not always easy to quit since nicotine is a very addictive drug. Oftentimes, people try 3 times or more before being able to quit. This document explains the best ways for you to prepare to quit smoking. Quitting takes hard work and a lot of effort, but you can do it. ADVANTAGES OF QUITTING SMOKING  You will live longer, feel better, and live better.  Your body will feel the impact of quitting smoking almost immediately.  Within 20 minutes, blood pressure decreases. Your pulse returns to its normal level.  After 8 hours, carbon monoxide levels in the blood return to normal. Your oxygen level increases.  After 24 hours, the chance of having a heart attack starts to decrease. Your breath, hair, and body stop smelling like smoke.  After 48 hours, damaged nerve endings begin to recover. Your sense of taste and smell improve.  After 72 hours, the body is virtually free of nicotine. Your bronchial tubes relax and breathing becomes easier.  After 2 to 12 weeks, lungs can hold more air. Exercise becomes easier and circulation improves.  The risk of having a heart attack, stroke, cancer, or lung disease is greatly reduced.  After 1 year, the risk of coronary heart disease is cut in half.  After 5 years, the risk of stroke falls to the same as a nonsmoker.  After 10 years, the risk of lung cancer is cut in half and the risk of other cancers decreases significantly.  After 15 years, the risk of coronary heart disease drops, usually to the level of a nonsmoker.  If you are pregnant, quitting smoking will improve your chances of having a healthy baby.  The people you live with, especially any children, will be healthier.  You will have extra money to  spend on things other than cigarettes. QUESTIONS TO THINK ABOUT BEFORE ATTEMPTING TO QUIT You may want to talk about your answers with your health care provider.  Why do you want to quit?  If you tried to quit in the past, what helped and what did not?  What will be the most difficult situations for you after you quit? How will you plan to handle them?  Who can help you through the tough times? Your family? Friends? A health care provider?  What pleasures do you get from smoking? What ways can you still get pleasure if you quit? Here are some questions to ask your health care provider:  How can you help me to be successful at quitting?  What medicine do you think would be best for me and how should I take it?  What should I do if I need more help?  What is smoking withdrawal like? How can I get information on withdrawal? GET READY  Set a quit date.  Change your environment by getting rid of all cigarettes, ashtrays, matches, and lighters in your home, car, or work. Do not let people smoke in your home.  Review your past attempts to quit. Think about what worked and what did not. GET SUPPORT AND ENCOURAGEMENT You have a better chance of being successful if you have help. You can get support in many ways.  Tell your family, friends, and coworkers that you are going to quit and need their support. Ask  them not to smoke around you.  Get individual, group, or telephone counseling and support. Programs are available at [facility] and [facility]. Call your local health department for information about programs in your area.  Spiritual beliefs and practices may help some smokers quit.  Download a "quit meter" on your computer to keep track of quit statistics, such as how long you have gone without smoking, cigarettes not smoked, and money saved.  Get a self-help book about quitting smoking and staying off tobacco. LEARN NEW SKILLS AND BEHAVIORS  Distract yourself from  urges to smoke. Talk to someone, go for a walk, or occupy your time with a task.  Change your normal routine. Take a different route to work. Drink tea instead of coffee. Eat breakfast in a different place.  Reduce your stress. Take a hot bath, exercise, or read a book.  Plan something enjoyable to do every day. Reward yourself for not smoking.  Explore interactive web-based programs that specialize in helping you quit. GET MEDICINE AND USE IT CORRECTLY Medicines can help you stop smoking and decrease the urge to smoke. Combining medicine with the above behavioral methods and support can greatly increase your chances of successfully quitting smoking.  Nicotine replacement therapy helps deliver nicotine to your body without the negative effects and risks of smoking. Nicotine replacement therapy includes nicotine gum, lozenges, inhalers, nasal sprays, and skin patches. Some may be available over-the-counter and others require a prescription.  Antidepressant medicine helps people abstain from smoking, but how this works is unknown. This medicine is available by prescription.  Nicotinic receptor partial agonist medicine simulates the effect of nicotine in your brain. This medicine is available by prescription. Ask your health care provider for advice about which medicines to use and how to use them based on your health history. Your health care provider will tell you what side effects to look out for if you choose to be on a medicine or therapy. Carefully read the information on the package. Do not use any other product containing nicotine while using a nicotine replacement product.  RELAPSE OR DIFFICULT SITUATIONS Most relapses occur within the first 3 months after quitting. Do not be discouraged if you start smoking again. Remember, most people try several times before finally quitting. You may have symptoms of withdrawal because your body is used to nicotine. You may crave cigarettes, be irritable,  feel very hungry, cough often, get headaches, or have difficulty concentrating. The withdrawal symptoms are only temporary. They are strongest when you first quit, but they will go away within 10-14 days. To reduce the chances of relapse, try to:  Avoid drinking alcohol. Drinking lowers your chances of successfully quitting.  Reduce the amount of caffeine you consume. Once you quit smoking, the amount of caffeine in your body increases and can give you symptoms, such as a rapid heartbeat, sweating, and anxiety.  Avoid smokers because they can make you want to smoke.  Do not let weight gain distract you. Many smokers will gain weight when they quit, usually less than 10 pounds. Eat a healthy diet and stay active. You can always lose the weight gained after you quit.  Find ways to improve your mood other than smoking. FOR MORE INFORMATION  www.smokefree.gov  Document Released: 04/22/2001 Document Revised: 09/12/2013 Document Reviewed: 08/07/2011 [facility] Patient Information 2015 [place], [place]. This information is not intended to replace advice given to you by your health care provider. Make sure you discuss any questions you have with your  health care provider.  

## 2014-04-12 NOTE — Assessment & Plan Note (Signed)
Will check PSA. If normal no further workup.

## 2014-04-12 NOTE — Assessment & Plan Note (Signed)
This is clearly not cardiac. EKG and chest x-ray were unremarkable. He does smoke and has been advised to stop. This is most likely tension or strain. I've asked him to get back into exercising on regular basis since that does not make it worse. There is no pharmacological therapy is indicated. No further cardiac workup. All questions answered.

## 2014-04-12 NOTE — Progress Notes (Signed)
HPI Bruce Steele this 43 year old black male referred from the emergency room for chronic chest pain. He's been having chest pain described as a sharp stabbing pain lateral to his left breast and also occasional pressure upper sternum. It comes and goes and is not related to activity. He used to lift weights and work out quite a bit but did not notice association of the pain with this. He also does not recall any over strain or injury.   He has multiple psychosomatic complaints including headaches that come and go, not feeling well in general, and he is concerned that he might have prostate cancer. His father has a history of prostate cancer at a young age.  He smokes on regular basis. He has not exercised in months.  He says he sleeps okay and his appetite is good.  History reviewed. No pertinent past medical history.  Current Outpatient Prescriptions  Medication Sig Dispense Refill  . aspirin 325 MG tablet Take 81 mg by mouth daily.     Marland Kitchen. acetaminophen (TYLENOL) 500 MG tablet Take 1,000 mg by mouth every 6 (six) hours as needed for mild pain.     No current facility-administered medications for this visit.    No Known Allergies  History reviewed. No pertinent family history.  History   Social History  . Marital Status: Single    Spouse Name: N/A    Number of Children: N/A  . Years of Education: N/A   Occupational History  . Not on file.   Social History Main Topics  . Smoking status: Current Every Day Smoker    Types: Cigars  . Smokeless tobacco: Not on file  . Alcohol Use: Yes     Comment: occ  . Drug Use: Yes    Special: Marijuana  . Sexual Activity: Not on file   Other Topics Concern  . Not on file   Social History Narrative    ROS ALL NEGATIVE EXCEPT THOSE NOTED IN HPI  PE  General Appearance: well developed, well nourished in no acute distress HEENT: symmetrical face, PERRLA, good dentition  Neck: no JVD, thyromegaly, or adenopathy, trachea  midline Chest: symmetric without deformity Cardiac: PMI non-displaced, RRR, normal S1, S2, no gallop or murmur Lung: clear to ausculation and percussion Vascular: all pulses full without bruits  Abdominal: nondistended, nontender, good bowel sounds, no HSM, no bruits Extremities: no cyanosis, clubbing or edema, no sign of DVT, no varicosities  Skin: normal color, no rashes Neuro: alert and oriented x 3, non-focal Pysch: normal affect  EKG One in the emergency room reviewed shows normal sinus rhythm and essentially normal EKG BMET    Component Value Date/Time   NA 139 03/28/2014 2255   K 3.7 03/28/2014 2255   CL 105 03/28/2014 2255   CO2 23 03/28/2014 2255   GLUCOSE 96 03/28/2014 2255   BUN 12 03/28/2014 2255   CREATININE 1.02 03/28/2014 2255   CALCIUM 8.9 03/28/2014 2255   GFRNONAA 90* 03/28/2014 2255   GFRAA >90 03/28/2014 2255    Lipid Panel  No results found for: CHOL, TRIG, HDL, CHOLHDL, VLDL, LDLCALC  CBC    Component Value Date/Time   WBC 5.9 03/28/2014 2255   RBC 4.25 03/28/2014 2255   HGB 11.8* 03/28/2014 2255   HCT 35.8* 03/28/2014 2255   PLT 221 03/28/2014 2255   MCV 84.2 03/28/2014 2255   MCH 27.8 03/28/2014 2255   MCHC 33.0 03/28/2014 2255   RDW 15.4 03/28/2014 2255   LYMPHSABS 2.9 03/28/2014 2255  MONOABS 0.6 03/28/2014 2255   EOSABS 0.4 03/28/2014 2255   BASOSABS 0.0 03/28/2014 2255

## 2014-04-12 NOTE — Progress Notes (Signed)
Patient here for hospital follow-up for chest pain. Patient went to ED on 03/28/14 for chest pain. Patient currently denies chest pain but indicates he has experienced intermittent chest pain for the last several months. Patient indicates his chest pain started as a needling pain but has progressed to a more intense, heavy pain. Last had chest pain yesterday. Patient also indicates he has been experiencing right-sided headaches for about two months and numbness and coldness of his fingers Occasionally having SOB. Patient is a smoker but is cutting back-now smoking about one cigar/day.

## 2014-04-13 LAB — PSA: PSA: 0.87 ng/mL (ref <=4.00)

## 2014-04-14 ENCOUNTER — Telehealth: Payer: Self-pay

## 2014-04-14 NOTE — Telephone Encounter (Signed)
-----   Message from Gaylord Shihhomas C Wall, MD sent at 04/14/2014 10:57 AM EST ----- Reassure patient it is normal.

## 2014-04-14 NOTE — Telephone Encounter (Signed)
Placed call to patient to inform of lab results. Unable to reach patient ; left message requesting return call.

## 2014-04-17 ENCOUNTER — Telehealth: Payer: Self-pay | Admitting: General Practice

## 2014-04-17 NOTE — Telephone Encounter (Signed)
Please contact patient again to give last lab results;

## 2014-04-18 ENCOUNTER — Telehealth: Payer: Self-pay | Admitting: General Practice

## 2014-04-18 ENCOUNTER — Telehealth: Payer: Self-pay | Admitting: Emergency Medicine

## 2014-04-18 NOTE — Telephone Encounter (Signed)
Pt given lab results 

## 2014-04-18 NOTE — Telephone Encounter (Signed)
Pt following-up on cancer results, please f/u with pt as soon as possible.

## 2014-04-18 NOTE — Telephone Encounter (Signed)
Pt. Returning a call from the nurse, pt. Would like to speak to nurse as soon as possible about results. Please f/u with pt.

## 2014-04-19 ENCOUNTER — Telehealth: Payer: Self-pay

## 2014-04-19 NOTE — Telephone Encounter (Signed)
Patient called and given his lab results.  Patient appreciative of information.

## 2014-04-19 NOTE — Telephone Encounter (Signed)
-----   Message from Thomas C Wall, MD sent at 04/14/2014 10:57 AM EST ----- Reassure patient it is normal. 

## 2014-04-21 ENCOUNTER — Encounter (HOSPITAL_COMMUNITY): Payer: Self-pay | Admitting: Emergency Medicine

## 2014-04-21 DIAGNOSIS — R1084 Generalized abdominal pain: Secondary | ICD-10-CM | POA: Insufficient documentation

## 2014-04-21 DIAGNOSIS — Z7982 Long term (current) use of aspirin: Secondary | ICD-10-CM | POA: Insufficient documentation

## 2014-04-21 DIAGNOSIS — R0789 Other chest pain: Secondary | ICD-10-CM | POA: Insufficient documentation

## 2014-04-21 DIAGNOSIS — Z720 Tobacco use: Secondary | ICD-10-CM | POA: Insufficient documentation

## 2014-04-21 DIAGNOSIS — F419 Anxiety disorder, unspecified: Secondary | ICD-10-CM | POA: Insufficient documentation

## 2014-04-21 DIAGNOSIS — R0602 Shortness of breath: Secondary | ICD-10-CM | POA: Insufficient documentation

## 2014-04-21 NOTE — ED Notes (Signed)
Pt. reports intermittent mid chest / palpitations and mid abdominal pain for several months with occasional dry cough and mild SOB .

## 2014-04-22 ENCOUNTER — Emergency Department (HOSPITAL_COMMUNITY)
Admission: EM | Admit: 2014-04-22 | Discharge: 2014-04-22 | Disposition: A | Discharge status: Home / Self Care | Payer: Self-pay | Attending: Emergency Medicine | Admitting: Emergency Medicine

## 2014-04-22 ENCOUNTER — Emergency Department (HOSPITAL_COMMUNITY): Payer: Self-pay

## 2014-04-22 DIAGNOSIS — R1084 Generalized abdominal pain: Secondary | ICD-10-CM

## 2014-04-22 DIAGNOSIS — R079 Chest pain, unspecified: Secondary | ICD-10-CM

## 2014-04-22 DIAGNOSIS — R0789 Other chest pain: Secondary | ICD-10-CM

## 2014-04-22 LAB — HEPATIC FUNCTION PANEL
ALBUMIN: 3.9 g/dL (ref 3.5–5.2)
ALT: 13 U/L (ref 0–53)
AST: 21 U/L (ref 0–37)
Alkaline Phosphatase: 88 U/L (ref 39–117)
Bilirubin, Direct: <0.2 mg/dL (ref 0.0–0.3)
Total Bilirubin: 0.7 mg/dL (ref 0.3–1.2)
Total Protein: 7.3 g/dL (ref 6.0–8.3)

## 2014-04-22 LAB — BASIC METABOLIC PANEL
Anion gap: 13 (ref 5–15)
BUN: 17 mg/dL (ref 6–23)
CHLORIDE: 100 meq/L (ref 96–112)
CO2: 25 mEq/L (ref 19–32)
Calcium: 9.3 mg/dL (ref 8.4–10.5)
Creatinine, Ser: 1.18 mg/dL (ref 0.50–1.35)
GFR calc Af Amer: 87 mL/min — ABNORMAL LOW (ref >90)
GFR, EST NON AFRICAN AMERICAN: 75 mL/min — AB (ref >90)
GLUCOSE: 117 mg/dL — AB (ref 70–99)
Potassium: 4.1 mEq/L (ref 3.7–5.3)
Sodium: 138 mEq/L (ref 137–147)

## 2014-04-22 LAB — I-STAT TROPONIN, ED: TROPONIN I, POC: 0 ng/mL (ref 0.00–0.08)

## 2014-04-22 LAB — CBC
HEMATOCRIT: 36 % — AB (ref 39.0–52.0)
Hemoglobin: 12.1 g/dL — ABNORMAL LOW (ref 13.0–17.0)
MCH: 28.5 pg (ref 26.0–34.0)
MCHC: 33.6 g/dL (ref 30.0–36.0)
MCV: 84.7 fL (ref 78.0–100.0)
Platelets: 222 10*3/uL (ref 150–400)
RBC: 4.25 MIL/uL (ref 4.22–5.81)
RDW: 15.1 % (ref 11.5–15.5)
WBC: 5.1 10*3/uL (ref 4.0–10.5)

## 2014-04-22 LAB — PRO B NATRIURETIC PEPTIDE: Pro B Natriuretic peptide (BNP): <5 pg/mL (ref 0–125)

## 2014-04-22 LAB — LIPASE, BLOOD: Lipase: 55 U/L (ref 11–59)

## 2014-04-22 MED ORDER — LANSOPRAZOLE 30 MG PO CPDR: 30.0000 mg | DELAYED_RELEASE_CAPSULE | Freq: Every day | ORAL | Status: DC

## 2014-04-22 MED ORDER — GI COCKTAIL ~~LOC~~
30.0000 mL | Freq: Once | ORAL | Status: AC
  Administered 2014-04-22: 30 mL via ORAL
  Filled 2014-04-22: qty 30

## 2014-04-22 NOTE — Discharge Instructions (Signed)
Chest Pain (Nonspecific) °It is often hard to give a diagnosis for the cause of chest pain. There is always a chance that your pain could be related to something serious, such as a heart attack or a blood clot in the lungs. You need to follow up with your doctor. °HOME CARE °· If antibiotic medicine was given, take it as directed by your doctor. Finish the medicine even if you start to feel better. °· For the next few days, avoid activities that bring on chest pain. Continue physical activities as told by your doctor. °· Do not use any tobacco products. This includes cigarettes, chewing tobacco, and e-cigarettes. °· Avoid drinking alcohol. °· Only take medicine as told by your doctor. °· Follow your doctor's suggestions for more testing if your chest pain does not go away. °· Keep all doctor visits you made. °GET HELP IF: °· Your chest pain does not go away, even after treatment. °· You have a rash with blisters on your chest. °· You have a fever. °GET HELP RIGHT AWAY IF:  °· You have more pain or pain that spreads to your arm, neck, jaw, back, or belly (abdomen). °· You have shortness of breath. °· You cough more than usual or cough up blood. °· You have very bad back or belly pain. °· You feel sick to your stomach (nauseous) or throw up (vomit). °· You have very bad weakness. °· You pass out (faint). °· You have chills. °This is an emergency. Do not wait to see if the problems will go away. Call your local emergency services (911 in [place] Do not drive yourself to the hospital. °MAKE SURE YOU:  °· Understand these instructions. °· Will watch your condition. °· Will get help right away if you are not doing well or get worse. °Document Released: 10/15/2007 Document Revised: 05/03/2013 Document Reviewed: 10/15/2007 °ExitCare® Patient Information ©2015 [employer]. This information is not intended to replace advice given to you by your health care provider. Make sure you discuss any questions you have with your  health care provider. °Gastritis, Adult °Gastritis is soreness and swelling (inflammation) of the lining of the stomach. Gastritis can develop as a sudden onset (acute) or long-term (chronic) condition. If gastritis is not treated, it can lead to stomach bleeding and ulcers. °CAUSES  °Gastritis occurs when the stomach lining is weak or damaged. Digestive juices from the stomach then inflame the weakened stomach lining. The stomach lining may be weak or damaged due to viral or bacterial infections. One common bacterial infection is the Helicobacter pylori infection. Gastritis can also result from excessive alcohol consumption, taking certain medicines, or having too much acid in the stomach.  °SYMPTOMS  °In some cases, there are no symptoms. When symptoms are present, they may include: °· Pain or a burning sensation in the upper abdomen. °· Nausea. °· Vomiting. °· An uncomfortable feeling of fullness after eating. °DIAGNOSIS  °Your caregiver may suspect you have gastritis based on your symptoms and a physical exam. To determine the cause of your gastritis, your caregiver may perform the following: °· Blood or stool tests to check for the H pylori bacterium. °· Gastroscopy. A thin, flexible tube (endoscope) is passed down the esophagus and into the stomach. The endoscope has a light and camera on the end. Your caregiver uses the endoscope to view the inside of the stomach. °· Taking a tissue sample (biopsy) from the stomach to examine under a microscope. °TREATMENT  °Depending on the cause of your   gastritis, medicines may be prescribed. If you have a bacterial infection, such as an H pylori infection, antibiotics may be given. If your gastritis is caused by too much acid in the stomach, H2 blockers or antacids may be given. Your caregiver may recommend that you stop taking aspirin, ibuprofen, or other nonsteroidal anti-inflammatory drugs (NSAIDs). °HOME CARE INSTRUCTIONS °· Only take over-the-counter or prescription  medicines as directed by your caregiver. °· If you were given antibiotic medicines, take them as directed. Finish them even if you start to feel better. °· Drink enough fluids to keep your urine clear or pale yellow. °· Avoid foods and drinks that make your symptoms worse, such as: °¨ Caffeine or alcoholic drinks. °¨ Chocolate. °¨ Peppermint or mint flavorings. °¨ Garlic and onions. °¨ Spicy foods. °¨ Citrus fruits, such as oranges, lemons, or limes. °¨ Tomato-based foods such as sauce, chili, salsa, and pizza. °¨ Fried and fatty foods. °· Eat small, frequent meals instead of large meals. °SEEK IMMEDIATE MEDICAL CARE IF:  °· You have black or dark red stools. °· You vomit blood or material that looks like coffee grounds. °· You are unable to keep fluids down. °· Your abdominal pain gets worse. °· You have a fever. °· You do not feel better after 1 week. °· You have any other questions or concerns. °MAKE SURE YOU: °· Understand these instructions. °· Will watch your condition. °· Will get help right away if you are not doing well or get worse. °Document Released: 04/22/2001 Document Revised: 10/28/2011 Document Reviewed: 06/11/2011 °ExitCare® Patient Information ©2015 [employer]. This information is not intended to replace advice given to you by your health care provider. Make sure you discuss any questions you have with your health care provider. ° °

## 2014-04-22 NOTE — ED Provider Notes (Signed)
CSN: [id number]     Arrival date & time 04/21/14  2335 History  This chart was scribed for Loren Raceravid Chrissi Crow, MD by Abel PrestoKara Demonbreun, ED Scribe. This patient was seen in room A05C/A05C and the patient's care was started at 12:46 AM.    Chief Complaint  Patient presents with  . Chest Pain  . Abdominal Pain    Patient is a 43 y.o. male presenting with chest pain and abdominal pain. The history is provided by the patient. No language interpreter was used.  Chest Pain Associated symptoms: abdominal pain and shortness of breath   Associated symptoms: no back pain, no dizziness, no fever, no headache, no nausea, no numbness, no palpitations, not vomiting and no weakness   Abdominal Pain Associated symptoms: chest pain and shortness of breath   Associated symptoms: no chills, no constipation, no diarrhea, no fever, no nausea and no vomiting     HPI Comments: Bruce Steele is a 43 y.o. male who presents to the Emergency Department complaining of intermittent chest pain for several months but becoming constant 2 weeks ago. Pt describes the pain as a stabbing pain when in the left side but a pressure when in the middle. Pt notes associated SOB and dry cough. Pt also notes a stabbing 4/10 abdominal pain but states the two pains are separate feelings. He states he has taken aspirin and Mylanta for relief.  Pt states he began smoking at age 43 but recently quit. He's been worked up for the chest pain in the past without any diagnosis being made. No recent travel or surgeries. No family history for early coronary artery disease. Pt states he has had recent stressors in his life. States his brother died in October.  Abdominal pain is unaccompanied by nausea or vomiting. He's had no constipation or diarrhea. Denies any melena or gross blood in stool.  History reviewed. No pertinent past medical history. History reviewed. No pertinent past surgical history. No family history on file. History  Substance Use  Topics  . Smoking status: Current Every Day Smoker    Types: Cigars  . Smokeless tobacco: Not on file  . Alcohol Use: Yes     Comment: occ    Review of Systems  Constitutional: Negative for fever and chills.  Respiratory: Positive for shortness of breath.   Cardiovascular: Positive for chest pain. Negative for palpitations and leg swelling.  Gastrointestinal: Positive for abdominal pain. Negative for nausea, vomiting, diarrhea, constipation and blood in stool.  Musculoskeletal: Negative for back pain, neck pain and neck stiffness.  Skin: Negative for rash and wound.  Neurological: Negative for dizziness, weakness, light-headedness, numbness and headaches.  All other systems reviewed and are negative.     Allergies  Review of patient's allergies indicates no known allergies.  Home Medications   Prior to Admission medications   Medication Sig Start Date End Date Taking? Authorizing Provider  acetaminophen (TYLENOL) 500 MG tablet Take 1,000 mg by mouth every 6 (six) hours as needed for mild pain.   Yes Historical Provider, MD  alum & mag hydroxide-simeth (MAALOX/MYLANTA) 200-200-20 MG/5ML suspension Take 20 mLs by mouth every 6 (six) hours as needed for indigestion or heartburn.   Yes Historical Provider, MD  aspirin 325 MG tablet Take 325 mg by mouth daily.    Yes Historical Provider, MD   BP 114/68 mmHg  Pulse 61  Temp(Src) 97.9 F (36.6 C) (Oral)  Resp 11  SpO2 100% Physical Exam  Constitutional: He is oriented to person, place,  and time. He appears well-developed and well-nourished. No distress.  Patient is anxious appearing  HENT:  Head: Normocephalic and atraumatic.  Mouth/Throat: Oropharynx is clear and moist. No oropharyngeal exudate.  Eyes: EOM are normal. Pupils are equal, round, and reactive to light.  Neck: Normal range of motion. Neck supple.  Cardiovascular: Normal rate and regular rhythm.  Exam reveals no gallop and no friction rub.   No murmur  heard. Pulmonary/Chest: Effort normal and breath sounds normal. No respiratory distress. He has no wheezes. He has no rales. He exhibits no tenderness.  Abdominal: Soft. Bowel sounds are normal. He exhibits no distension and no mass. There is tenderness (mild tenderness just superior to the umbilicus. There is no rebound or guarding.). There is no rebound and no guarding.  Musculoskeletal: Normal range of motion. He exhibits no edema or tenderness.  No calf swelling or tenderness.  Neurological: He is alert and oriented to person, place, and time.  5/5 motor in all extremities. Sensation is intact.  Skin: Skin is warm and dry. No rash noted. No erythema.  Psychiatric: He has a normal mood and affect. His behavior is normal.  Nursing note and vitals reviewed.   ED Course  Procedures (including critical care time) DIAGNOSTIC STUDIES: Oxygen Saturation is 95% on room air, normal by my interpretation.    COORDINATION OF CARE: 12:54 AM Discussed treatment plan with patient at beside, the patient agrees with the plan and has no further questions at this time.   Labs Review Labs Reviewed  CBC - Abnormal; Notable for the following:    Hemoglobin 12.1 (*)    HCT 36.0 (*)    All other components within normal limits  BASIC METABOLIC PANEL - Abnormal; Notable for the following:    Glucose, Bld 117 (*)    GFR calc non Af Amer 75 (*)    GFR calc Af Amer 87 (*)    All other components within normal limits  PRO B NATRIURETIC PEPTIDE  HEPATIC FUNCTION PANEL  LIPASE, BLOOD  I-STAT TROPOININ, ED    Imaging Review Dg Chest 2 View  04/22/2014   CLINICAL DATA:  Increased shortness of breath, chest pain. Chest pain for 3-4 months. Shortness of breath for weeks to the time but now persistent. Previous smoker.  EXAM: CHEST  2 VIEW  COMPARISON:  03/29/2014  FINDINGS: The heart size and mediastinal contours are within normal limits. Both lungs are clear. The visualized skeletal structures are  unremarkable.  IMPRESSION: No active cardiopulmonary disease.   Electronically Signed   By: Burman NievesWilliam  Stevens M.D.   On: 04/22/2014 01:40     EKG Interpretation None      MDM   Final diagnoses:  Atypical chest pain  Generalized abdominal pain    I personally performed the services described in this documentation, which was scribed in my presence. The recorded information has been reviewed and is accurate.  She presents with atypical chest pain and periumbilical abdominal pain. The chest pain has been going on for several months. EKG without acute findings. Initial troponin is normal. This would be a very abnormal presentation for coronary artery disease. Patient's only risk factor is smoking history. Think his symptoms are likely related to GERD/gastritis versus anxiety and stress.  Heart score is 1. Low risk for PE by Perc and Wells criteria.  Troponin 2 is normal. Patient states he is feeling much better after the GI cocktail. Advised follow-up as outpatient. Will start on PPI. Return precautions given.  Onalee Huaavid  Ranae Palms, MD 04/22/14 [phone]

## 2014-04-22 NOTE — ED Notes (Signed)
Pt reports having intermittent chest pain that is substernal and radiates to left side of chest. Describes it as pressure in the middle and sharp/stabbing on the left side. Pt also reports sob with the pain. Pt states he has abd pain at the umbilicus, described as sharp. Pt reports fluttering feeling in chest, stomach and leg. Pt states nothing makes pain better, and nothing makes pain worse. Pt rates pain 3/10 at present.

## 2014-05-16 ENCOUNTER — Emergency Department (HOSPITAL_COMMUNITY): Payer: Self-pay

## 2014-05-16 ENCOUNTER — Emergency Department (HOSPITAL_COMMUNITY)
Admission: EM | Admit: 2014-05-16 | Discharge: 2014-05-16 | Disposition: A | Discharge status: Home / Self Care | Payer: Self-pay | Attending: Emergency Medicine | Admitting: Emergency Medicine

## 2014-05-16 ENCOUNTER — Encounter (HOSPITAL_COMMUNITY): Payer: Self-pay

## 2014-05-16 DIAGNOSIS — Z720 Tobacco use: Secondary | ICD-10-CM | POA: Insufficient documentation

## 2014-05-16 DIAGNOSIS — R1013 Epigastric pain: Secondary | ICD-10-CM | POA: Insufficient documentation

## 2014-05-16 DIAGNOSIS — R079 Chest pain, unspecified: Secondary | ICD-10-CM

## 2014-05-16 DIAGNOSIS — Z7982 Long term (current) use of aspirin: Secondary | ICD-10-CM | POA: Insufficient documentation

## 2014-05-16 DIAGNOSIS — R0789 Other chest pain: Secondary | ICD-10-CM | POA: Insufficient documentation

## 2014-05-16 DIAGNOSIS — Z79899 Other long term (current) drug therapy: Secondary | ICD-10-CM | POA: Insufficient documentation

## 2014-05-16 LAB — COMPREHENSIVE METABOLIC PANEL
ALT: 12 U/L (ref 0–53)
AST: 17 U/L (ref 0–37)
Albumin: 3.6 g/dL (ref 3.5–5.2)
Alkaline Phosphatase: 81 U/L (ref 39–117)
Anion gap: 8 (ref 5–15)
BILIRUBIN TOTAL: 0.5 mg/dL (ref 0.3–1.2)
BUN: 16 mg/dL (ref 6–23)
CHLORIDE: 107 meq/L (ref 96–112)
CO2: 24 mmol/L (ref 19–32)
Calcium: 8.9 mg/dL (ref 8.4–10.5)
Creatinine, Ser: 1.2 mg/dL (ref 0.50–1.35)
GFR, EST AFRICAN AMERICAN: 85 mL/min — AB (ref >90)
GFR, EST NON AFRICAN AMERICAN: 74 mL/min — AB (ref >90)
GLUCOSE: 99 mg/dL (ref 70–99)
Potassium: 4 mmol/L (ref 3.5–5.1)
Sodium: 139 mmol/L (ref 135–145)
Total Protein: 6.5 g/dL (ref 6.0–8.3)

## 2014-05-16 LAB — CBC WITH DIFFERENTIAL/PLATELET
BASOS PCT: 1 % (ref 0–1)
Basophils Absolute: 0 10*3/uL (ref 0.0–0.1)
EOS PCT: 5 % (ref 0–5)
Eosinophils Absolute: 0.2 10*3/uL (ref 0.0–0.7)
HEMATOCRIT: 36.9 % — AB (ref 39.0–52.0)
Hemoglobin: 12.7 g/dL — ABNORMAL LOW (ref 13.0–17.0)
LYMPHS PCT: 42 % (ref 12–46)
Lymphs Abs: 1.8 10*3/uL (ref 0.7–4.0)
MCH: 29.9 pg (ref 26.0–34.0)
MCHC: 34.4 g/dL (ref 30.0–36.0)
MCV: 86.8 fL (ref 78.0–100.0)
MONO ABS: 0.3 10*3/uL (ref 0.1–1.0)
Monocytes Relative: 8 % (ref 3–12)
NEUTROS ABS: 1.9 10*3/uL (ref 1.7–7.7)
Neutrophils Relative %: 44 % (ref 43–77)
PLATELETS: 215 10*3/uL (ref 150–400)
RBC: 4.25 MIL/uL (ref 4.22–5.81)
RDW: 15.4 % (ref 11.5–15.5)
WBC: 4.3 10*3/uL (ref 4.0–10.5)

## 2014-05-16 LAB — URINALYSIS, ROUTINE W REFLEX MICROSCOPIC
Bilirubin Urine: NEGATIVE
GLUCOSE, UA: NEGATIVE mg/dL
HGB URINE DIPSTICK: NEGATIVE
KETONES UR: NEGATIVE mg/dL
Leukocytes, UA: NEGATIVE
Nitrite: NEGATIVE
PROTEIN: NEGATIVE mg/dL
Specific Gravity, Urine: 1.021 (ref 1.005–1.030)
Urobilinogen, UA: 0.2 mg/dL (ref 0.0–1.0)
pH: 5.5 (ref 5.0–8.0)

## 2014-05-16 LAB — TROPONIN I: Troponin I: <0.03 ng/mL (ref <0.031)

## 2014-05-16 LAB — LIPASE, BLOOD: Lipase: 45 U/L (ref 11–59)

## 2014-05-16 MED ORDER — LANSOPRAZOLE 30 MG PO CPDR: 30.0000 mg | DELAYED_RELEASE_CAPSULE | Freq: Every day | ORAL | Status: DC

## 2014-05-16 MED ORDER — GI COCKTAIL ~~LOC~~
30.0000 mL | Freq: Once | ORAL | Status: AC
  Administered 2014-05-16: 30 mL via ORAL
  Filled 2014-05-16: qty 30

## 2014-05-16 NOTE — Discharge Instructions (Signed)
Chest Pain (Nonspecific) There is no evidence of a heart attack. Take The stomach medicine as we discussed. Establish care with a primary care doctor. Avoid alcohol, caffeine, NSAIDs. Return to the ED with new or worsening symptoms. It is often hard to give a specific diagnosis for the cause of chest pain. There is always a chance that your pain could be related to something serious, such as a heart attack or a blood clot in the lungs. You need to follow up with your health care provider for further evaluation. CAUSES   Heartburn.  Pneumonia or bronchitis.  Anxiety or stress.  Inflammation around your heart (pericarditis) or lung (pleuritis or pleurisy).  A blood clot in the lung.  A collapsed lung (pneumothorax). It can develop suddenly on its own (spontaneous pneumothorax) or from trauma to the chest.  Shingles infection (herpes zoster virus). The chest wall is composed of bones, muscles, and cartilage. Any of these can be the source of the pain.  The bones can be bruised by injury.  The muscles or cartilage can be strained by coughing or overwork.  The cartilage can be affected by inflammation and become sore (costochondritis). DIAGNOSIS  Lab tests or other studies may be needed to find the cause of your pain. Your health care provider may have you take a test called an ambulatory electrocardiogram (ECG). An ECG records your heartbeat patterns over a 24-hour period. You may also have other tests, such as:  Transthoracic echocardiogram (TTE). During echocardiography, sound waves are used to evaluate how blood flows through your heart.  Transesophageal echocardiogram (TEE).  Cardiac monitoring. This allows your health care provider to monitor your heart rate and rhythm in real time.  Holter monitor. This is a portable device that records your heartbeat and can help diagnose heart arrhythmias. It allows your health care provider to track your heart activity for several days, if  needed.  Stress tests by exercise or by giving medicine that makes the heart beat faster. TREATMENT   Treatment depends on what may be causing your chest pain. Treatment may include:  Acid blockers for heartburn.  Anti-inflammatory medicine.  Pain medicine for inflammatory conditions.  Antibiotics if an infection is present.  You may be advised to change lifestyle habits. This includes stopping smoking and avoiding alcohol, caffeine, and chocolate.  You may be advised to keep your head raised (elevated) when sleeping. This reduces the chance of acid going backward from your stomach into your esophagus. Most of the time, nonspecific chest pain will improve within 2-3 days with rest and mild pain medicine.  HOME CARE INSTRUCTIONS   If antibiotics were prescribed, take them as directed. Finish them even if you start to feel better.  For the next few days, avoid physical activities that bring on chest pain. Continue physical activities as directed.  Do not use any tobacco products, including cigarettes, chewing tobacco, or electronic cigarettes.  Avoid drinking alcohol.  Only take medicine as directed by your health care provider.  Follow your health care provider's suggestions for further testing if your chest pain does not go away.  Keep any follow-up appointments you made. If you do not go to an appointment, you could develop lasting (chronic) problems with pain. If there is any problem keeping an appointment, call to reschedule. SEEK MEDICAL CARE IF:   Your chest pain does not go away, even after treatment.  You have a rash with blisters on your chest.  You have a fever. SEEK IMMEDIATE MEDICAL CARE  IF:   You have increased chest pain or pain that spreads to your arm, neck, jaw, back, or abdomen.  You have shortness of breath.  You have an increasing cough, or you cough up blood.  You have severe back or abdominal pain.  You feel nauseous or vomit.  You have severe  weakness.  You faint.  You have chills. This is an emergency. Do not wait to see if the pain will go away. Get medical help at once. Call your local emergency services (911 in [place] Do not drive yourself to the hospital. MAKE SURE YOU:   Understand these instructions.  Will watch your condition.  Will get help right away if you are not doing well or get worse. Document Released: 02/05/2005 Document Revised: 05/03/2013 Document Reviewed: 12/02/2007 [facility] Patient Information 2015 [place], [place]. This information is not intended to replace advice given to you by your health care provider. Make sure you discuss any questions you have with your health care provider.

## 2014-05-16 NOTE — ED Notes (Signed)
Pt. Presents to ED with complaint of intermittent chest and abd pain since thanksgiving. Pt. States that pain in chest varies in intensity and is sharp/shooting. Pt. States he feels SOB when pain occurs. Denies N/V/D, diaphoresis, back pain. States pain radiates to jaw and arm.

## 2014-05-16 NOTE — ED Notes (Signed)
Patient transported to X-ray 

## 2014-05-16 NOTE — ED Provider Notes (Signed)
CSN: [id number]     Arrival date & time 05/16/14  4098 History   First MD Initiated Contact with Patient 05/16/14 [phone]     Chief Complaint  Patient presents with  . Chest Pain     (Consider location/radiation/quality/duration/timing/severity/associated sxs/prior Treatment) HPI Comments: Patient presents with intermittent central chest pain and central abdominal pain since Thanksgiving. Pain comes and goes lasting a few seconds at a time it is sometimes substernal, sometimes epigastric sometimes left-sided. He has been evaluated multiple times for this pain most recently on December 11 and he has seen cardiology in December who determined his pain was not cardiac. The pain is associated with shortness of breath. No nausea, diaphoresis, vomiting. The longest the pain lasts is about 3 or 4 seconds. He was prescribed Prevacid which she is not taking. He was never had a stress test. He is a smoker. No family history of early coronary artery disease. No recent travel or recent surgeries.  The history is provided by the patient. The history is limited by the condition of the patient.    History reviewed. No pertinent past medical history. History reviewed. No pertinent past surgical history. No family history on file. History  Substance Use Topics  . Smoking status: Current Every Day Smoker    Types: Cigars  . Smokeless tobacco: Not on file  . Alcohol Use: Yes     Comment: occ    Review of Systems  Constitutional: Negative for fever, activity change and appetite change.  HENT: Negative for congestion and rhinorrhea.   Respiratory: Positive for chest tightness and shortness of breath. Negative for cough.   Cardiovascular: Positive for chest pain.  Gastrointestinal: Positive for abdominal pain. Negative for nausea and vomiting.  Genitourinary: Negative for testicular pain.  Musculoskeletal: Negative for myalgias, joint swelling, arthralgias and neck pain.  Skin: Negative for wound.   Neurological: Negative for dizziness, weakness and headaches.  A complete 10 system review of systems was obtained and all systems are negative except as noted in the HPI and PMH.      Allergies  Review of patient's allergies indicates no known allergies.  Home Medications   Prior to Admission medications   Medication Sig Start Date End Date Taking? Authorizing Provider  acetaminophen (TYLENOL) 500 MG tablet Take 1,000 mg by mouth every 6 (six) hours as needed for mild pain.   Yes Historical Provider, MD  alum & mag hydroxide-simeth (MAALOX/MYLANTA) 200-200-20 MG/5ML suspension Take 20 mLs by mouth every 6 (six) hours as needed for indigestion or heartburn.   Yes Historical Provider, MD  aspirin 325 MG tablet Take 325 mg by mouth daily.    Yes Historical Provider, MD  lansoprazole (PREVACID) 30 MG capsule Take 1 capsule (30 mg total) by mouth daily at 12 noon. 05/16/14   Glynn Octave, MD   BP 113/73 mmHg  Pulse 51  Temp(Src) 97.5 F (36.4 C) (Oral)  Resp 13  SpO2 100% Physical Exam  Constitutional: He is oriented to person, place, and time. He appears well-developed and well-nourished. No distress.  HENT:  Head: Normocephalic and atraumatic.  Mouth/Throat: Oropharynx is clear and moist. No oropharyngeal exudate.  Eyes: Conjunctivae and EOM are normal. Pupils are equal, round, and reactive to light.  Neck: Normal range of motion. Neck supple.  Cardiovascular: Normal rate, regular rhythm and normal heart sounds.   No murmur heard. Pulmonary/Chest: Effort normal and breath sounds normal. No respiratory distress. He exhibits no tenderness.  Abdominal: Soft. There is tenderness. There is  no rebound and no guarding.  Minimal epigastric tenderness  Musculoskeletal: Normal range of motion. He exhibits no edema or tenderness.  Neurological: He is alert and oriented to person, place, and time. No cranial nerve deficit. He exhibits normal muscle tone. Coordination normal.  Skin: Skin is  warm.    ED Course  Procedures (including critical care time) Labs Review Labs Reviewed  CBC WITH DIFFERENTIAL - Abnormal; Notable for the following:    Hemoglobin 12.7 (*)    HCT 36.9 (*)    All other components within normal limits  COMPREHENSIVE METABOLIC PANEL - Abnormal; Notable for the following:    GFR calc non Af Amer 74 (*)    GFR calc Af Amer 85 (*)    All other components within normal limits  LIPASE, BLOOD  URINALYSIS, ROUTINE W REFLEX MICROSCOPIC  TROPONIN I    Imaging Review Dg Abd Acute W/chest  05/16/2014   CLINICAL DATA:  Chest and right upper abdominal pain on and off since Thanksgiving  EXAM: ACUTE ABDOMEN SERIES (ABDOMEN 2 VIEW & CHEST 1 VIEW)  COMPARISON:  04/22/2014  FINDINGS: Cardiomediastinal silhouette is stable. No acute infiltrate or pleural effusion. No pulmonary edema. There is nonspecific nonobstructive bowel gas pattern. Some stool noted in right colon and descending colon. No free abdominal air.  IMPRESSION: Nonspecific nonobstructive bowel gas pattern. Some stool noted in right colon and descending colon. No acute cardiopulmonary disease. No free abdominal air.   Electronically Signed   By: Natasha MeadLiviu  Pop M.D.   On: 05/16/2014 09:01     EKG Interpretation   Date/Time:  Tuesday May 16 2014 08:23:17 EST Ventricular Rate:  61 PR Interval:  175 QRS Duration: 111 QT Interval:  390 QTC Calculation: 393 R Axis:   79 Text Interpretation:  Sinus rhythm ST elev, probable normal early repol  pattern No significant change was found Confirmed by Manus GunningANCOUR  MD, Kynedi Profitt  [phone] on 05/16/2014 8:38:40 AM      MDM   Final diagnoses:  Chest pain  Atypical chest pain  Epigastric pain   ongoing chest pain and epigastric pain for the past 2 months. Pain is atypical for ACS. It lasts a few seconds at a time. EKG with diffuse ST elevation consistent with early repolarization.  PERC negative.  LFTs normal. Lipase normal. Chest x-ray negative. Troponin  negative.  Patient sleeping comfortably. Epigastric pain continues on exam. No right upper quadrant pain. Continue PPI as prescribed previously. Avoid alcohol, NSAIDs and caffeine. Needs to establish care with PCP. Low suspicion for ACS or PE. Return precautions discussed.  BP 113/73 mmHg  Pulse 51  Temp(Src) 97.5 F (36.4 C) (Oral)  Resp 13  SpO2 100%    Glynn OctaveStephen Jestin Burbach, MD 05/16/14 [phone]

## 2014-05-17 ENCOUNTER — Ambulatory Visit: Payer: Self-pay | Attending: Cardiology | Admitting: Cardiology

## 2014-05-17 ENCOUNTER — Encounter: Payer: Self-pay | Admitting: Cardiology

## 2014-05-17 VITALS — BP 135/94 | HR 73 | Temp 98.1°F | Resp 20 | Ht 68.0 in | Wt 156.0 lb

## 2014-05-17 DIAGNOSIS — G8929 Other chronic pain: Secondary | ICD-10-CM | POA: Insufficient documentation

## 2014-05-17 DIAGNOSIS — Z8042 Family history of malignant neoplasm of prostate: Secondary | ICD-10-CM

## 2014-05-17 DIAGNOSIS — K529 Noninfective gastroenteritis and colitis, unspecified: Secondary | ICD-10-CM | POA: Insufficient documentation

## 2014-05-17 DIAGNOSIS — R079 Chest pain, unspecified: Secondary | ICD-10-CM

## 2014-05-17 DIAGNOSIS — R072 Precordial pain: Secondary | ICD-10-CM | POA: Insufficient documentation

## 2014-05-17 DIAGNOSIS — R1013 Epigastric pain: Secondary | ICD-10-CM | POA: Insufficient documentation

## 2014-05-17 NOTE — Progress Notes (Signed)
Patient in ED 05/16/14 with substernal, epigastric pain.   Patient indicates he has pain in his substernal, epigastric areas daily. This has been going on for about two months.  Patient also indicates he also has begun to have pain in his neck, jaw, legs, arms.  He had a "tearing pain" yesterday. Patient indicates he has this pain daily but it is not constant. Patient denies pain at this time. Patient also indicates he has had indigestion, gas for >1 month and diarrhea >2 months. Patient has not filled script for Prevacid. Patient stopped smoking around Thanksgiving.

## 2014-05-17 NOTE — Progress Notes (Signed)
Mr. Bruce Steele returns to clinic today after being seen in the emergency room yesterday with his chronic pain. This time it was more lower substernal and epigastric. He denies any nausea or vomiting or reflux symptoms per se. He does have chronic diarrhea which is not changed. He denies any melena. His blood work was normal including a hemoglobin of 12.7 and MCV that was normal. His albumin was normal at 3.6 and his LFTs were normal. Abdominal x-ray was negative for any acute problem and no free air. Stool pattern was normal. Previous chest x-ray was normal. EKG has been normal. A repeat troponin which is been done multiple times in the ER was negative.  On last visit, he had multiple psychosomatic complaints. He denies being under increased stress. He says he's had stress all his life. He seems to be sleeping okay and he is eating regularly. 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 He lives alone but has good housing. He has a few friends and girls that he goes out with.  He was given Prevacid but has not filled it. He can only paying cash but has applied for private insurance.  His exam today shows him to be in no acute distress. He does get emotional with tears when I told him that this was nothing serious or life-threatening. He seems to be fearful. He has somewhat of a flat affect.  Neck shows no JVD carotids are symmetrical bilaterally without bruits. No thyromegaly. Lungs are clear. Heart exam is normal. Abdominal exam is soft with no pulsatile mass. There is no Murphy sign or hepatomegaly. Extremities no cyanosis clubbing or edema.  All laboratory data chest x-ray, KUB, and EKG reviewed. EKG shows early repolarization.

## 2014-05-17 NOTE — Patient Instructions (Addendum)
  Please return to see Dr. Daleen SquibbWall in two weeks. It was great seeing you today.

## 2014-05-17 NOTE — Assessment & Plan Note (Signed)
We will treat him for gastritis. Our pharmacy gave him a free one month fill of omeprazole 40 mg by mouth every morning. Will start today. Will not use Prevacid as we do not have it in our pharmacy. I will schedule him for a follow with me in 2 weeks. I've advised him not to go to the emergency room again. I have a clinical suspicion that he is depressed. We may start an antidepressant on next visit. I discussed this at length with him today and he agrees to the plan.

## 2014-05-17 NOTE — Assessment & Plan Note (Signed)
Recent PSA per his request was normal.

## 2014-05-17 NOTE — Assessment & Plan Note (Signed)
C under abdominal pain

## 2014-05-31 ENCOUNTER — Encounter: Payer: Self-pay | Admitting: Cardiology

## 2014-05-31 ENCOUNTER — Ambulatory Visit: Payer: Self-pay | Attending: Cardiology | Admitting: Cardiology

## 2014-05-31 VITALS — BP 132/85 | HR 61 | Temp 97.8°F | Resp 20 | Ht 68.0 in | Wt 159.0 lb

## 2014-05-31 DIAGNOSIS — R079 Chest pain, unspecified: Secondary | ICD-10-CM

## 2014-05-31 DIAGNOSIS — F329 Major depressive disorder, single episode, unspecified: Secondary | ICD-10-CM | POA: Insufficient documentation

## 2014-05-31 DIAGNOSIS — F418 Other specified anxiety disorders: Secondary | ICD-10-CM

## 2014-05-31 DIAGNOSIS — F32A Depression, unspecified: Secondary | ICD-10-CM

## 2014-05-31 DIAGNOSIS — F419 Anxiety disorder, unspecified: Secondary | ICD-10-CM | POA: Insufficient documentation

## 2014-05-31 DIAGNOSIS — R1013 Epigastric pain: Secondary | ICD-10-CM

## 2014-05-31 DIAGNOSIS — R072 Precordial pain: Secondary | ICD-10-CM | POA: Insufficient documentation

## 2014-05-31 MED ORDER — OMEPRAZOLE 40 MG PO CPDR: 40.0000 mg | DELAYED_RELEASE_CAPSULE | Freq: Every day | ORAL | Status: DC

## 2014-05-31 MED ORDER — SERTRALINE HCL 50 MG PO TABS: 50.0000 mg | ORAL_TABLET | Freq: Every day | ORAL | Status: DC

## 2014-05-31 NOTE — Patient Instructions (Signed)
It was great seeing you again today. Continue taking omeprazole 40 mg daily. Sertraline 50 mg daily has been ordered.   Please take all medications as prescribed.  [facility]. Please follow-up with Dr. Daleen SquibbWall in 4 weeks.

## 2014-05-31 NOTE — Progress Notes (Signed)
Patient here for follow-up of epigastric, substernal pain. Patient indicates he has been taking omeprazole daily and his diarrhea has now stopped. Gas and indigestion have decreased, but he still has it occasionally Complains of intermittent epigastric pain but constant left hip pain-3/10 Depression screen (PHQ-9) score 13, GAD-7 score of 15- this was reported to Dr. Daleen SquibbWall.

## 2014-05-31 NOTE — Assessment & Plan Note (Signed)
This has resolved.

## 2014-05-31 NOTE — Progress Notes (Signed)
Mr. Bruce Steele returns today for evaluation and management of his chronic pain. I felt he had gastritis and placed him on omeprazole which has remarkably improved his lower substernal discomfort and epigastric discomfort. His diarrhea which has been bothering him for several months has resolved.  His depression and anxiety screen are high. I was concerned about this on last visit. Please see note by Peterson LombardJessica Beck RN.  The patient was not examined today. No test were ordered.

## 2014-05-31 NOTE — Assessment & Plan Note (Signed)
He scores high on the anxiety and depression screens. I've had a long talk with him today and he agrees to start sertraline 50mg /day. This was discussed with a Pharm D. Our pharmacy can supply this for $4 a month. I will see him back in 4 weeks and hopefully will be significant improvement in his symptoms.

## 2014-05-31 NOTE — Assessment & Plan Note (Signed)
This is mostly resolved. I think this was gastritis. Will continue the omeprazole daily.

## 2014-06-28 ENCOUNTER — Encounter: Payer: Self-pay | Admitting: Cardiology

## 2014-06-28 ENCOUNTER — Telehealth: Payer: Self-pay

## 2014-06-28 ENCOUNTER — Ambulatory Visit: Payer: Self-pay | Attending: Cardiology | Admitting: Cardiology

## 2014-06-28 VITALS — BP 125/82 | HR 60 | Temp 98.7°F | Resp 16 | Ht 68.0 in | Wt 159.0 lb

## 2014-06-28 DIAGNOSIS — F329 Major depressive disorder, single episode, unspecified: Secondary | ICD-10-CM | POA: Insufficient documentation

## 2014-06-28 DIAGNOSIS — F418 Other specified anxiety disorders: Secondary | ICD-10-CM

## 2014-06-28 DIAGNOSIS — F419 Anxiety disorder, unspecified: Secondary | ICD-10-CM | POA: Insufficient documentation

## 2014-06-28 DIAGNOSIS — R1013 Epigastric pain: Secondary | ICD-10-CM | POA: Insufficient documentation

## 2014-06-28 NOTE — Telephone Encounter (Signed)
Patient called to see if he could reschedule appointment. Patient indicates he just walked into clinic. Spoke with Dr. Daleen SquibbWall who indicates he will see patient now.

## 2014-06-28 NOTE — Patient Instructions (Signed)
It was great seeing you again today.   Please follow-up with Dr. Daleen SquibbWall again in 6-8 weeks.

## 2014-06-28 NOTE — Progress Notes (Signed)
Pt is here following up after the doctor prescribed Zoloft. Pt reports having pain in his mid abodmen and his extremity's but he said that the pain has gotten better. Pt needs his medications refilled.

## 2014-06-28 NOTE — Assessment & Plan Note (Signed)
This has previously responded to omeprazole. He just ran out of his medications to 3 days ago. Will renew with 11 refills. Patient instructed on a new process.

## 2014-06-28 NOTE — Progress Notes (Signed)
Mr. Bruce Steele returns today for follow-up of his abdominal pain, multiple somatic complaints, and feeling anxious. Please see my previous notes an extensive evaluation.  He's feeling better on the low-dose sertraline. He has been on it now for about 3 weeks. He ran out of his omeprazole to 3 days ago and again have some abdominal discomfort again.  Exam is unchanged.

## 2014-06-28 NOTE — Assessment & Plan Note (Signed)
He is feeling much better on low-dose sertraline. Continue at 50 mg a day with 3 months worth of refills. I will see him back again in 6-8 weeks. We will then turned over to primary care.

## 2014-07-19 ENCOUNTER — Encounter: Payer: Self-pay | Admitting: Cardiology

## 2014-07-19 ENCOUNTER — Ambulatory Visit: Payer: Self-pay | Attending: Cardiology | Admitting: Cardiology

## 2014-07-19 VITALS — BP 120/76 | HR 67 | Temp 98.0°F | Resp 18 | Ht 68.0 in | Wt 161.0 lb

## 2014-07-19 DIAGNOSIS — F418 Other specified anxiety disorders: Secondary | ICD-10-CM | POA: Insufficient documentation

## 2014-07-19 DIAGNOSIS — F419 Anxiety disorder, unspecified: Secondary | ICD-10-CM

## 2014-07-19 DIAGNOSIS — F329 Major depressive disorder, single episode, unspecified: Secondary | ICD-10-CM

## 2014-07-19 MED ORDER — SERTRALINE HCL 50 MG PO TABS: 50.0000 mg | ORAL_TABLET | Freq: Every day | ORAL | Status: DC

## 2014-07-19 NOTE — Progress Notes (Signed)
Patient here for follow-up-continues to complain of intermittent epigastric pain. Now having normal bowel movements. Denies chest pain, but indicates he has occasional shortness of breath. He indicates overall his symptoms have improved considerably. Compliant with medications.

## 2014-08-21 ENCOUNTER — Emergency Department (INDEPENDENT_AMBULATORY_CARE_PROVIDER_SITE_OTHER)
Admission: EM | Admit: 2014-08-21 | Discharge: 2014-08-21 | Disposition: A | Discharge status: Home / Self Care | Payer: Self-pay | Source: Home / Self Care | Attending: Emergency Medicine | Admitting: Emergency Medicine

## 2014-08-21 ENCOUNTER — Encounter (HOSPITAL_COMMUNITY): Payer: Self-pay | Admitting: Emergency Medicine

## 2014-08-21 DIAGNOSIS — R1011 Right upper quadrant pain: Secondary | ICD-10-CM

## 2014-08-21 LAB — POCT H PYLORI SCREEN: H. PYLORI SCREEN, POC: NEGATIVE

## 2014-08-21 NOTE — ED Notes (Signed)
Patient c/o flank pain for months now. He is concerned about his liver. He has made changes in his diet to try to relieve pain. NAD.

## 2014-08-21 NOTE — Discharge Instructions (Signed)
Increase your omeprazole to twice daily. Follow-up with the gastroenterologist listed above  Abdominal Pain Many things can cause abdominal pain. Usually, abdominal pain is not caused by a disease and will improve without treatment. It can often be observed and treated at home. Your health care provider will do a physical exam and possibly order blood tests and X-rays to help determine the seriousness of your pain. However, in many cases, more time must pass before a clear cause of the pain can be found. Before that point, your health care provider may not know if you need more testing or further treatment. HOME CARE INSTRUCTIONS  Monitor your abdominal pain for any changes. The following actions may help to alleviate any discomfort you are experiencing:  Only take over-the-counter or prescription medicines as directed by your health care provider.  Do not take laxatives unless directed to do so by your health care provider.  Try a clear liquid diet (broth, tea, or water) as directed by your health care provider. Slowly move to a bland diet as tolerated. SEEK MEDICAL CARE IF:  You have unexplained abdominal pain.  You have abdominal pain associated with nausea or diarrhea.  You have pain when you urinate or have a bowel movement.  You experience abdominal pain that wakes you in the night.  You have abdominal pain that is worsened or improved by eating food.  You have abdominal pain that is worsened with eating fatty foods.  You have a fever. SEEK IMMEDIATE MEDICAL CARE IF:   Your pain does not go away within 2 hours.  You keep throwing up (vomiting).  Your pain is felt only in portions of the abdomen, such as the right side or the left lower portion of the abdomen.  You pass bloody or black tarry stools. MAKE SURE YOU:  Understand these instructions.   Will watch your condition.   Will get help right away if you are not doing well or get worse.  Document Released:  02/05/2005 Document Revised: 05/03/2013 Document Reviewed: 01/05/2013 [facility] Patient Information 2015 [place], [place]. This information is not intended to replace advice given to you by your health care provider. Make sure you discuss any questions you have with your health care provider.

## 2014-08-21 NOTE — ED Provider Notes (Signed)
CSN: [id number]     Arrival date & time 08/21/14  1050 History   First MD Initiated Contact with Patient 08/21/14 1208     Chief Complaint  Patient presents with  . Flank Pain   (Consider location/radiation/quality/duration/timing/severity/associated sxs/prior Treatment) HPI     44 year old male presents for evaluation of right upper quadrant pain. He reports that for years she has had occasional exposure to fumes from bleach and he thinks this may have somehow damaged his liver. He has been seen for this exact problem with this exact complaint multiple times in the past few months, he has been worked up numerous times and it is always negative. He says that he cannot commit himself that everything is okay so it keeps coming back in to be seen. He denies any other associated symptoms including no leg swelling, dark urine, bowel or bladder changes, leg swelling, abdominal bloating, cough, chest pain, shortness of breath, dark stools. He has been diagnosed with gastritis in the past and treated with 40 mg of omeprazole which helped somewhat. He has never seen a gastroenterologist  History reviewed. No pertinent past medical history. History reviewed. No pertinent past surgical history. Family History  Problem Relation Age of Onset  . Prostate cancer Father    History  Substance Use Topics  . Smoking status: Former Smoker    Types: Cigars  . Smokeless tobacco: Not on file  . Alcohol Use: 0.0 oz/week    0 Standard drinks or equivalent per week     Comment: occ    Review of Systems  Gastrointestinal: Positive for abdominal pain. Negative for nausea, vomiting and diarrhea.  All other systems reviewed and are negative.   Allergies  Review of patient's allergies indicates no known allergies.  Home Medications   Prior to Admission medications   Medication Sig Start Date End Date Taking? Authorizing Provider  acetaminophen (TYLENOL) 500 MG tablet Take 1,000 mg by mouth every 6 (six) hours  as needed for mild pain.    Historical Provider, MD  omeprazole (PRILOSEC) 40 MG capsule Take 1 capsule (40 mg total) by mouth daily. 05/31/14   Gaylord Shih, MD  sertraline (ZOLOFT) 50 MG tablet Take 1 tablet (50 mg total) by mouth daily. 07/19/14   Gaylord Shih, MD   BP 114/75 mmHg  Pulse 66  Temp(Src) 98.3 F (36.8 C) (Oral)  Resp 18  SpO2 100% Physical Exam  Constitutional: He is oriented to person, place, and time. He appears well-developed and well-nourished. No distress.  HENT:  Head: Normocephalic and atraumatic.  Right Ear: External ear normal.  Left Ear: External ear normal.  Nose: Nose normal.  Mouth/Throat: Oropharynx is clear and moist. No oropharyngeal exudate.  Eyes: Conjunctivae are normal. No scleral icterus.  Neck: Normal range of motion. Neck supple. No JVD present.  Cardiovascular: Normal rate, regular rhythm, normal heart sounds and intact distal pulses.   Pulmonary/Chest: Effort normal and breath sounds normal. No respiratory distress.  Abdominal: Soft. Bowel sounds are normal. He exhibits no distension and no mass. There is no tenderness. There is no rebound and no guarding.  Neurological: He is alert and oriented to person, place, and time. Coordination normal.  Skin: Skin is warm and dry. No rash noted. He is not diaphoretic.  Psychiatric: Judgment normal. His mood appears anxious.  Nursing note and vitals reviewed.   ED Course  Procedures (including critical care time) Labs Review Labs Reviewed  POCT H PYLORI SCREEN    Imaging Review  No results found.   MDM   1. Right upper quadrant pain    His physical exam is normal. He has had multiple repeat labs which have remained normal and his symptoms have not changed since then so we will not repeat labs today. H. pylori has not been checked, his POC H pylori is negative here today. I will have him increase his omeprazole to twice daily and follow-up with gastroenterology.      Graylon GoodZachary H Emari Demmer,  PA-C 08/21/14 1249

## 2015-06-10 ENCOUNTER — Encounter (HOSPITAL_COMMUNITY): Payer: Self-pay | Admitting: Emergency Medicine

## 2015-06-10 ENCOUNTER — Emergency Department (HOSPITAL_COMMUNITY)
Admission: EM | Admit: 2015-06-10 | Discharge: 2015-06-10 | Disposition: A | Discharge status: Home / Self Care | Payer: Self-pay | Attending: Emergency Medicine | Admitting: Emergency Medicine

## 2015-06-10 DIAGNOSIS — Z87891 Personal history of nicotine dependence: Secondary | ICD-10-CM | POA: Insufficient documentation

## 2015-06-10 DIAGNOSIS — R21 Rash and other nonspecific skin eruption: Secondary | ICD-10-CM

## 2015-06-10 DIAGNOSIS — Z8619 Personal history of other infectious and parasitic diseases: Secondary | ICD-10-CM | POA: Insufficient documentation

## 2015-06-10 DIAGNOSIS — N529 Male erectile dysfunction, unspecified: Secondary | ICD-10-CM

## 2015-06-10 DIAGNOSIS — R1011 Right upper quadrant pain: Secondary | ICD-10-CM

## 2015-06-10 LAB — CBC WITH DIFFERENTIAL/PLATELET
Basophils Absolute: 0 10*3/uL (ref 0.0–0.1)
Basophils Relative: 1 %
EOS PCT: 5 %
Eosinophils Absolute: 0.3 10*3/uL (ref 0.0–0.7)
HCT: 40.1 % (ref 39.0–52.0)
Hemoglobin: 13.3 g/dL (ref 13.0–17.0)
LYMPHS ABS: 1.9 10*3/uL (ref 0.7–4.0)
LYMPHS PCT: 35 %
MCH: 28.2 pg (ref 26.0–34.0)
MCHC: 33.2 g/dL (ref 30.0–36.0)
MCV: 85.1 fL (ref 78.0–100.0)
MONOS PCT: 10 %
Monocytes Absolute: 0.5 10*3/uL (ref 0.1–1.0)
Neutro Abs: 2.8 10*3/uL (ref 1.7–7.7)
Neutrophils Relative %: 51 %
Platelets: 233 10*3/uL (ref 150–400)
RBC: 4.71 MIL/uL (ref 4.22–5.81)
RDW: 15.5 % (ref 11.5–15.5)
WBC: 5.5 10*3/uL (ref 4.0–10.5)

## 2015-06-10 LAB — URINALYSIS, ROUTINE W REFLEX MICROSCOPIC
BILIRUBIN URINE: NEGATIVE
Glucose, UA: NEGATIVE mg/dL
HGB URINE DIPSTICK: NEGATIVE
KETONES UR: NEGATIVE mg/dL
Leukocytes, UA: NEGATIVE
Nitrite: NEGATIVE
Protein, ur: NEGATIVE mg/dL
Specific Gravity, Urine: 1.006 (ref 1.005–1.030)
pH: 6.5 (ref 5.0–8.0)

## 2015-06-10 LAB — COMPREHENSIVE METABOLIC PANEL
ALK PHOS: 101 U/L (ref 38–126)
ALT: 18 U/L (ref 17–63)
ANION GAP: 11 (ref 5–15)
AST: 26 U/L (ref 15–41)
Albumin: 4.2 g/dL (ref 3.5–5.0)
BUN: 10 mg/dL (ref 6–20)
CO2: 26 mmol/L (ref 22–32)
CREATININE: 1.15 mg/dL (ref 0.61–1.24)
Calcium: 9.5 mg/dL (ref 8.9–10.3)
Chloride: 101 mmol/L (ref 101–111)
GFR calc Af Amer: >60 mL/min (ref >60)
Glucose, Bld: 85 mg/dL (ref 65–99)
Potassium: 3.3 mmol/L — ABNORMAL LOW (ref 3.5–5.1)
Sodium: 138 mmol/L (ref 135–145)
TOTAL PROTEIN: 7.8 g/dL (ref 6.5–8.1)
Total Bilirubin: 0.4 mg/dL (ref 0.3–1.2)

## 2015-06-10 LAB — LIPASE, BLOOD: LIPASE: 48 U/L (ref 11–51)

## 2015-06-10 MED ORDER — SULFAMETHOXAZOLE-TRIMETHOPRIM 800-160 MG PO TABS: 1.0000 | ORAL_TABLET | Freq: Two times a day (BID) | ORAL | Status: AC

## 2015-06-10 MED ORDER — CEPHALEXIN 500 MG PO CAPS: 500.0000 mg | ORAL_CAPSULE | Freq: Four times a day (QID) | ORAL | Status: DC

## 2015-06-10 MED ORDER — HYDROCORTISONE 1 % EX CREA: TOPICAL_CREAM | CUTANEOUS | Status: AC

## 2015-06-10 NOTE — Discharge Instructions (Signed)
You were seen today for several complaints including a rash, abdominal pain, and erectile dysfunction. Your lab work is reassuring. You need to establish primary care and follow-up for further involved workup. Regarding her rash, this may be related to eczema. It does appear that you have infection in addition to eczema. He will be given antibiotics. Less likely this is felt to be shingles.  Rash A rash is a change in the color or texture of the skin. There are many different types of rashes. You may have other problems that accompany your rash. CAUSES   Infections.  Allergic reactions. This can include allergies to pets or foods.  Certain medicines.  Exposure to certain chemicals, soaps, or cosmetics.  Heat.  Exposure to poisonous plants.  Tumors, both cancerous and noncancerous. SYMPTOMS   Redness.  Scaly skin.  Itchy skin.  Dry or cracked skin.  Bumps.  Blisters.  Pain. DIAGNOSIS  Your caregiver may do a physical exam to determine what type of rash you have. A skin sample (biopsy) may be taken and examined under a microscope. TREATMENT  Treatment depends on the type of rash you have. Your caregiver may prescribe certain medicines. For serious conditions, you may need to see a skin doctor (dermatologist). HOME CARE INSTRUCTIONS   Avoid the substance that caused your rash.  Do not scratch your rash. This can cause infection.  You may take cool baths to help stop itching.  Only take over-the-counter or prescription medicines as directed by your caregiver.  Keep all follow-up appointments as directed by your caregiver. SEEK IMMEDIATE MEDICAL CARE IF:  You have increasing pain, swelling, or redness.  You have a fever.  You have new or severe symptoms.  You have body aches, diarrhea, or vomiting.  Your rash is not better after 3 days. MAKE SURE YOU:  Understand these instructions.  Will watch your condition.  Will get help right away if you are not doing  well or get worse.   This information is not intended to replace advice given to you by your health care provider. Make sure you discuss any questions you have with your health care provider.   Document Released: 04/18/2002 Document Revised: 05/19/2014 Document Reviewed: 09/13/2014 Elsevier Interactive Patient Education [employer]

## 2015-06-10 NOTE — ED Notes (Signed)
Pt. presents with multiple complaints : RUQ pain for 1 year , denies nausea or vomitting , skin rashes at right side of neck and scalp onset last week and intermittent erectile dysfunction onset 2 weeks ago .

## 2015-06-10 NOTE — ED Provider Notes (Signed)
CSN: [id number]     Arrival date & time 06/10/15  0020 History  By signing my name below, I, Bruce Steele, attest that this documentation has been prepared under the direction and in the presence of Shon Baton, MD. Electronically Signed: Jarvis Steele, ED Scribe. 06/10/2015. 2:04 AM.    Chief Complaint  Patient presents with  . Rash  . Abdominal Pain  . Erectile Dysfunction   The history is provided by the patient. No language interpreter was used.    HPI Comments: Bruce Steele is a 45 y.o. male with no PMHx who presents to the Emergency Department with multiple complaints.  Pt's is complaining of a red, itchy, painful rash to the right side of his neck and scalp for 6 days. He denies any recent changes in detergents or soaps. He has not tried any treatment prior to arrival. He denies any alleviating/aggravating factors. Pt denies rash to other areas of his body. He denies significant pain to the area prior to the rash onset. Pt notes he had chicken pox in the past. He denies any fevers or chills.  Pt has a second complaint of intermittent, moderate, sharp, RUQ abdominal pain for 1 year. Pt reports associated indigestion. He denies any alleviating or aggravating factors. He denies any changes in pain before or after eating. Pt has not taken any medication prior to arrival. He denies any nausea, vomiting, diarrhea.  Pt has a third complaint of intermittent, erectile dysfuction onset 2 weeks. Pt states that he is having difficulty maintaining an erection. Pt denies any new sexual partners. Pt denies any h/o DM, kidney disease, HTN or high cholesterol. He denies any h/o ETOH, drug or tobacco abuse. Pt is not on any daily medication. He denies any penile discharge, penile pain, urinary symptoms or other associated complaints at this time.  History reviewed. No pertinent past medical history. History reviewed. No pertinent past surgical history. Family History  Problem Relation Age  of Onset  . Prostate cancer Father    Social History  Substance Use Topics  . Smoking status: Former Smoker    Types: Cigars  . Smokeless tobacco: None  . Alcohol Use: 0.0 oz/week    0 Standard drinks or equivalent per week     Comment: occ    Review of Systems  Constitutional: Negative for fever and chills.  Gastrointestinal: Positive for abdominal pain. Negative for nausea and vomiting.  Genitourinary: Negative for dysuria, urgency, frequency, discharge, difficulty urinating and penile pain.       Erectile dysfunction  Skin: Positive for rash.  All other systems reviewed and are negative.     Allergies  Review of patient's allergies indicates no known allergies.  Home Medications   Prior to Admission medications   Medication Sig Start Date End Date Taking? Authorizing Provider  cephALEXin (KEFLEX) 500 MG capsule Take 1 capsule (500 mg total) by mouth 4 (four) times daily. 06/10/15   Shon Baton, MD  hydrocortisone cream 1 % Apply to affected area 2 times daily 06/10/15   Shon Baton, MD  omeprazole (PRILOSEC) 40 MG capsule Take 1 capsule (40 mg total) by mouth daily. Patient not taking: Reported on 06/10/2015 05/31/14   Gaylord Shih, MD  sertraline (ZOLOFT) 50 MG tablet Take 1 tablet (50 mg total) by mouth daily. Patient not taking: Reported on 06/10/2015 07/19/14   Gaylord Shih, MD  sulfamethoxazole-trimethoprim (BACTRIM DS,SEPTRA DS) 800-160 MG tablet Take 1 tablet by mouth 2 (two) times daily. 06/10/15  06/17/15  Shon Baton, MD   BP 112/71 mmHg  Pulse 73  Temp(Src) 97.8 F (36.6 C) (Oral)  Resp 18  Wt 167 lb (75.751 kg)  SpO2 100% Physical Exam  Constitutional: He is oriented to person, place, and time. He appears well-developed and well-nourished. No distress.  HENT:  Head: Normocephalic and atraumatic.  Eyes: Pupils are equal, round, and reactive to light.  Neck: Normal range of motion. Neck supple.  Cardiovascular: Normal rate, regular rhythm and  normal heart sounds.   No murmur heard. Pulmonary/Chest: Effort normal and breath sounds normal. No respiratory distress. He has no wheezes.  Abdominal: Soft. Bowel sounds are normal. There is no tenderness. There is no rebound and no guarding.  Genitourinary:  Normal circumcised penis, no discharge noted  Musculoskeletal: He exhibits no edema.  Lymphadenopathy:    He has no cervical adenopathy.  Neurological: He is alert and oriented to person, place, and time.  Skin: Skin is warm and dry.     Multiple erythematous patches of papules over the right lower neck, right shoulder, and right anterior chest, overlying excoriations and erythema, mild warmth, no significant tenderness  Psychiatric: He has a normal mood and affect.  Nursing note and vitals reviewed.   ED Course  Procedures (including critical care time)  DIAGNOSTIC STUDIES: Oxygen Saturation is 100% on RA, normal by my interpretation.    COORDINATION OF CARE: 12:58 AM- Will order UA, CBC w/ diff, CMP and blood lipase. Pt advised of plan for treatment and pt agrees.    Labs Review Labs Reviewed  COMPREHENSIVE METABOLIC PANEL - Abnormal; Notable for the following:    Potassium 3.3 (*)    All other components within normal limits  CBC WITH DIFFERENTIAL/PLATELET  LIPASE, BLOOD  URINALYSIS, ROUTINE W REFLEX MICROSCOPIC (NOT AT [facility])    Imaging Review No results found. I have personally reviewed and evaluated these images and lab results as part of my medical decision-making.   EKG Interpretation None      MDM   Final diagnoses:  Rash  Right upper quadrant pain  Erectile dysfunction, unspecified erectile dysfunction type    Patient presents with multiple complaints. Most acute complaint appears to be onset of rash. He has multiple patches of distinct papules. While it is all distributed on side of the body, it appears to cross multiple dermatomes. It appears more papular than vesicular nature. While shingles  is a consideration, suspect something more like nummular eczema or dermatitis. Denies any new contacts. He also appears to have some overlying infection. Will treat with antibiotics and hydrocortisone cream.  Guarding his abdominal pain. He is currently pain-free. Reports that this is intermittent and ongoing over the last year. There may be a food association. Basic labwork is reassuring. Discussed with patient that he could trial an over-the-counter PPI to see if this helps. Regarding the patient's complaint of erectile dysfunction. There do not appear to be any anatomic abnormalities and urinalysis is reassuring. He needs to establish primary care for further, more intensive workup. Discussed this with the patient.  After history, exam, and medical workup I feel the patient has been appropriately medically screened and is safe for discharge home. Pertinent diagnoses were discussed with the patient. Patient was given return precautions.  I personally performed the services described in this documentation, which was scribed in my presence. The recorded information has been reviewed and is accurate.     Shon Baton, MD 06/10/15 [phone]

## 2016-04-28 IMAGING — CR DG CHEST 2V
2 series · 2 of 2 positions shown · non-contrast
Comparison: None.

CLINICAL DATA: Mid chest pain and cough

EXAM:
CHEST  2 VIEW

[w chest pa]
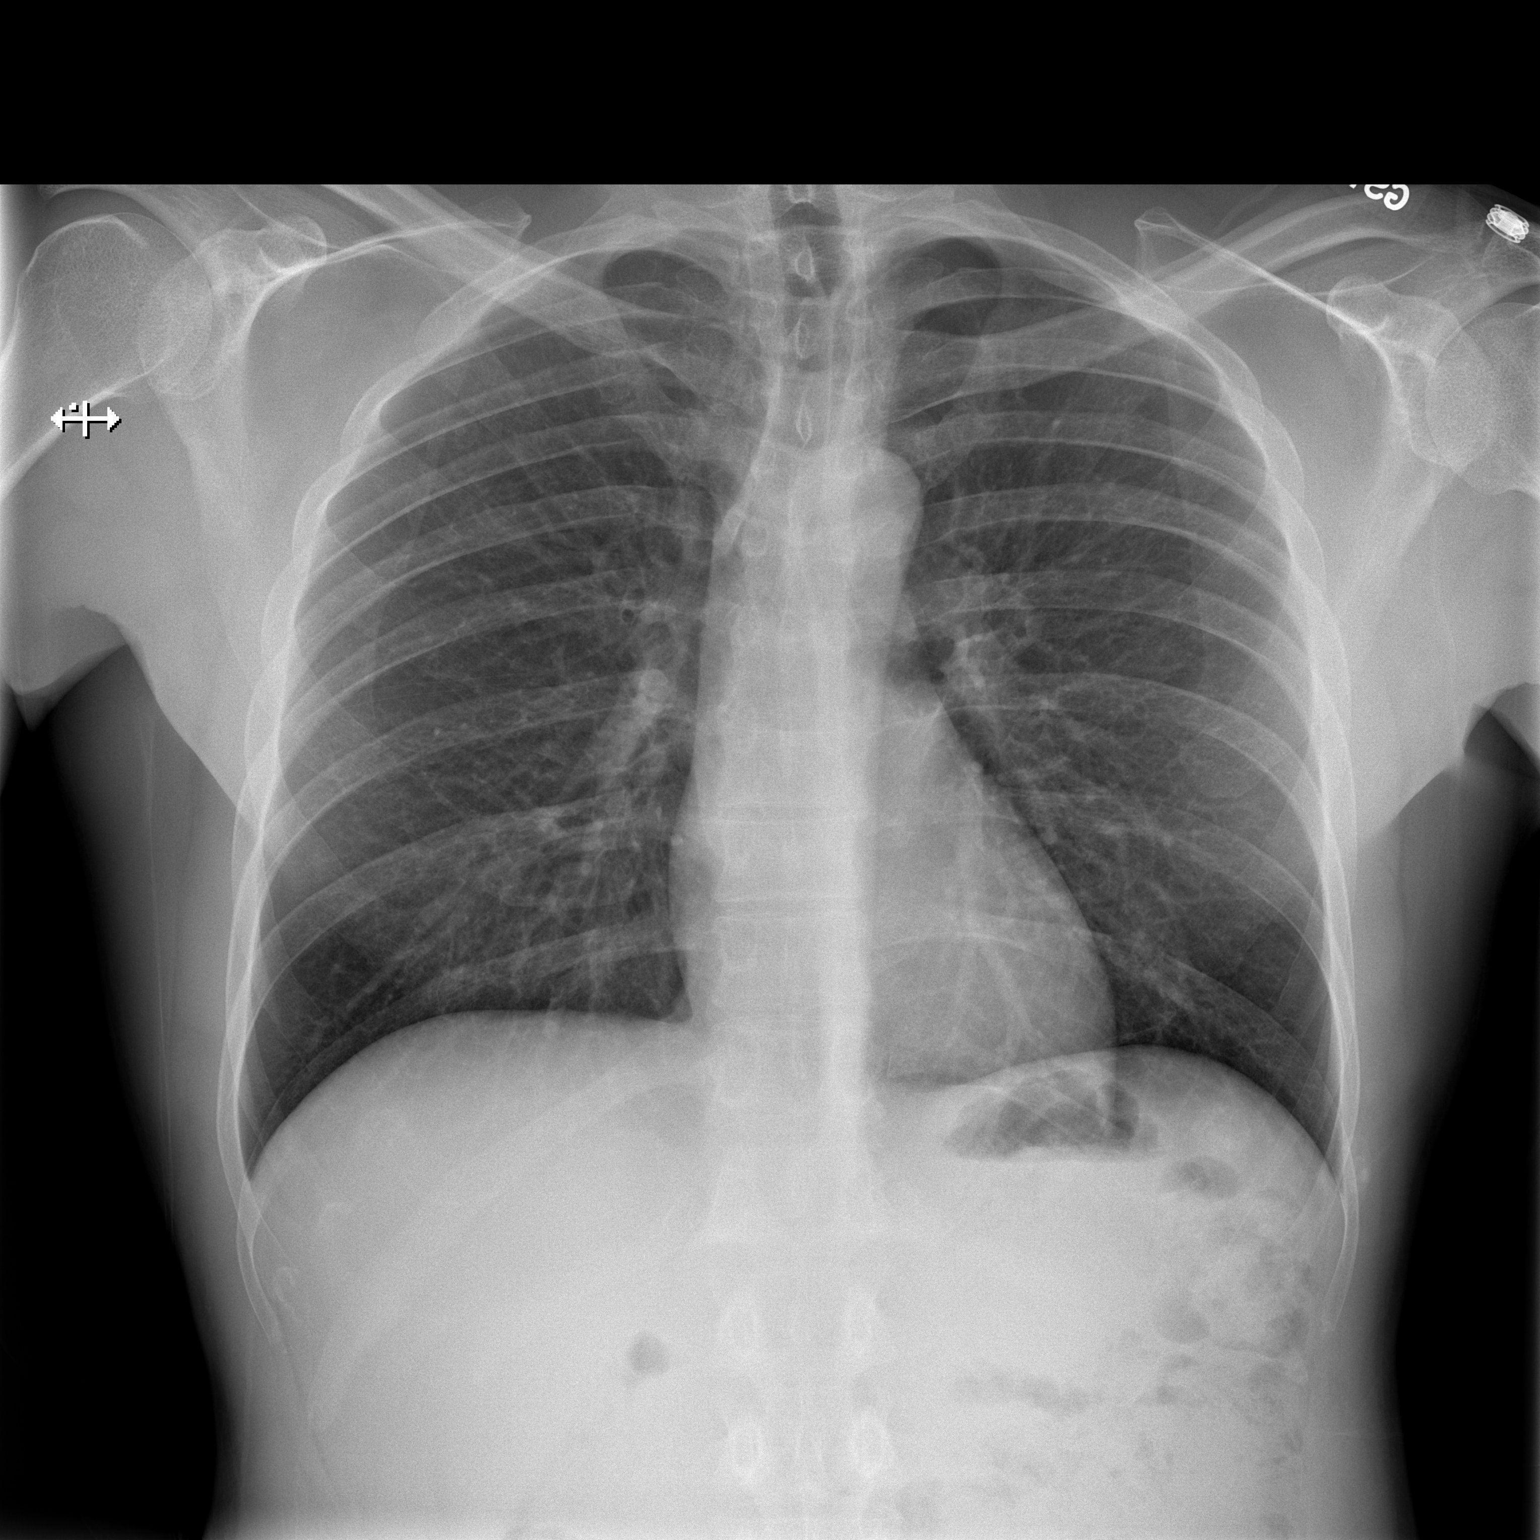

[w chest lat]
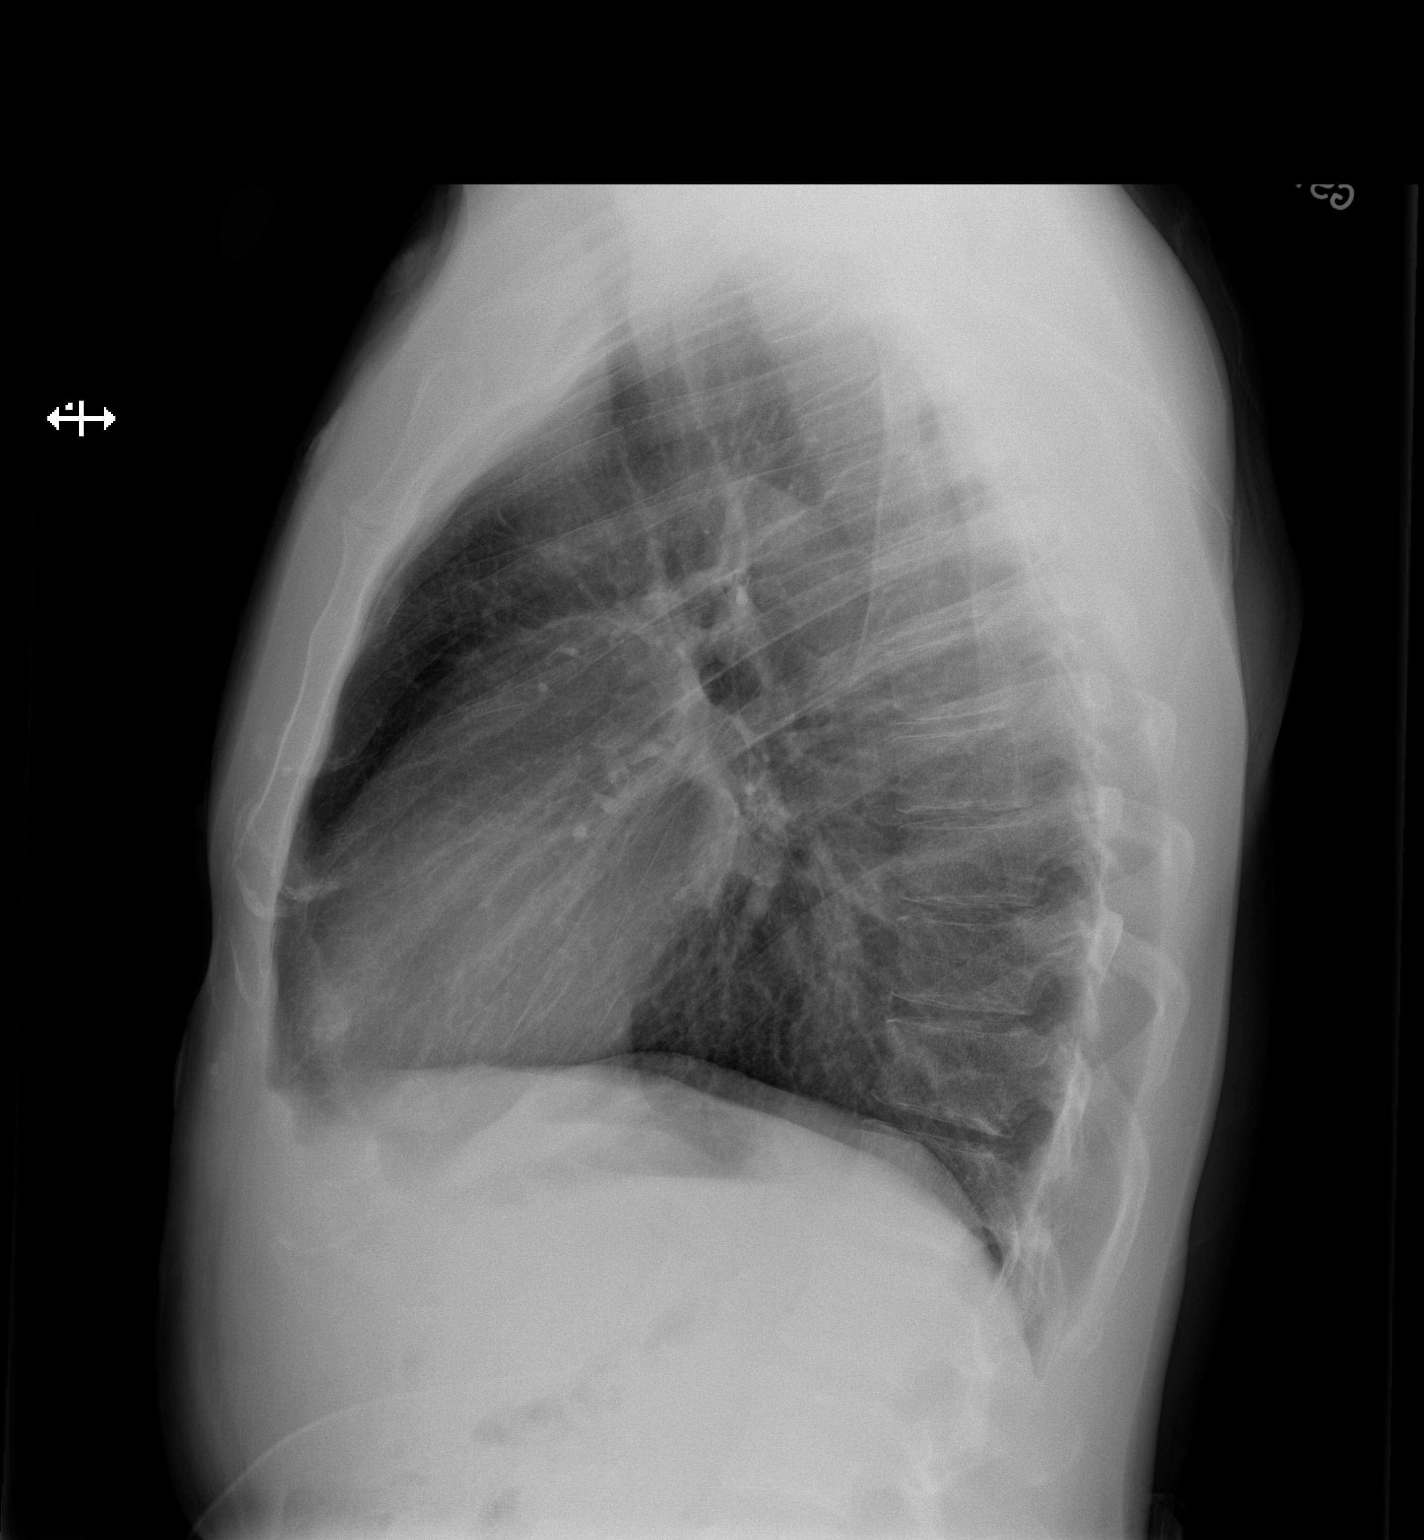

[2 of 2 positions shown; findings below may reference images not displayed]

FINDINGS: The heart size and mediastinal contours are within normal limits.
Both lungs are clear. The visualized skeletal structures are
unremarkable.
IMPRESSION: No active cardiopulmonary disease.

## 2016-12-07 ENCOUNTER — Emergency Department (HOSPITAL_COMMUNITY)
Admission: EM | Admit: 2016-12-07 | Discharge: 2016-12-07 | Disposition: A | Discharge status: Home / Self Care | Payer: Self-pay | Attending: Emergency Medicine | Admitting: Emergency Medicine

## 2016-12-07 ENCOUNTER — Encounter (HOSPITAL_COMMUNITY): Payer: Self-pay | Admitting: Emergency Medicine

## 2016-12-07 ENCOUNTER — Emergency Department (HOSPITAL_COMMUNITY): Payer: Self-pay

## 2016-12-07 DIAGNOSIS — S92152A Displaced avulsion fracture (chip fracture) of left talus, initial encounter for closed fracture: Secondary | ICD-10-CM | POA: Insufficient documentation

## 2016-12-07 DIAGNOSIS — F17290 Nicotine dependence, other tobacco product, uncomplicated: Secondary | ICD-10-CM | POA: Insufficient documentation

## 2016-12-07 DIAGNOSIS — S82891A Other fracture of right lower leg, initial encounter for closed fracture: Secondary | ICD-10-CM

## 2016-12-07 DIAGNOSIS — Y929 Unspecified place or not applicable: Secondary | ICD-10-CM | POA: Insufficient documentation

## 2016-12-07 DIAGNOSIS — Y999 Unspecified external cause status: Secondary | ICD-10-CM | POA: Insufficient documentation

## 2016-12-07 DIAGNOSIS — W16522A Jumping or diving into swimming pool striking bottom causing other injury, initial encounter: Secondary | ICD-10-CM | POA: Insufficient documentation

## 2016-12-07 DIAGNOSIS — Y9311 Activity, swimming: Secondary | ICD-10-CM | POA: Insufficient documentation

## 2016-12-07 MED ORDER — IBUPROFEN 600 MG PO TABS: 600.0000 mg | ORAL_TABLET | Freq: Four times a day (QID) | ORAL | 0 refills | Status: AC | PRN

## 2016-12-07 NOTE — ED Notes (Signed)
Crutch training completed by ortho tech.  Pt declined WC to waiting area

## 2016-12-07 NOTE — ED Notes (Signed)
Ortho called for crutch training

## 2016-12-07 NOTE — ED Triage Notes (Signed)
Reports breaking right ankle years ago. Reports pain and swelling since but was able to tolerate it.  Yesterday jumped into a pool and is having increased pain since.

## 2016-12-07 NOTE — Discharge Instructions (Signed)
Ibuprofen as needed for pain. Ice and alleviate for additional pain relief. If your symptoms persist without improvement in one week, please follow up with orthopedics listed.    TREATMENT  Rest, ice, elevation, and compression are the basic modes of treatment.    HOME CARE INSTRUCTIONS  Apply ice to the sore area for 15 to 20 minutes, 3 to 4 times per day. Do this while you are awake for the first 2 days. This can be stopped when the swelling goes away. Put the ice in a plastic bag and place a towel between the bag of ice and your skin.  Keep your leg elevated when possible to lessen swelling.  You may take off your ankle stabilizer at night and to take a shower or bath. Wiggle your toes several times per day if you are able.  ACTIVITY:            - Weight bearing as tolerated - if you can do it, do it. If it hurts, stop.             - Exercises should be limited to pain free range of motion            - Can start mobilization by tracing the alphabet with your foot in the air.       SEEK MEDICAL CARE IF:  You have an increase in bruising, swelling, or pain.  Your toes feel cold.  Pain relief is not achieved with medications.  EMERGENCY:: Your toes are numb or blue or you have severe pain.

## 2016-12-07 NOTE — Progress Notes (Signed)
Orthopedic Tech Progress Note Patient Details:  Bruce MannanRodney Steele 10-10-1970 [id number]  Ortho Devices Type of Ortho Device: Crutches Ortho Device/Splint Interventions: Ordered, Adjustment   Jennye MoccasinHughes, Velton Roselle Craig 12/07/2016, 9:47 PM

## 2016-12-07 NOTE — ED Provider Notes (Signed)
[facility]-EMERGENCY DEPT Provider Note   CSN: [id number] Arrival date & time: 12/07/16  1911 By signing my name below, I, Levon HedgerElizabeth Hall, attest that this documentation has been prepared under the direction and in the presence of non-physician practitioner, Shanna CiscoJamie Ward, PA-C. Electronically Signed: Levon HedgerElizabeth Hall, Scribe. 12/07/2016. 8:27 PM.   History   Chief Complaint Chief Complaint  Patient presents with  . Ankle Pain   HPI Bruce Steele is a 46 y.o. male who presents to the Emergency Department complaining of sudden onset, constant pain to right ankle onset last night. Yesterday, pt jumped into a shallow pool and struck his right ankle on the bottom of the pool. He reports associated swelling to the area. No OTC treatments tried for these symptoms PTA.  He has applied ice and elevated his foot with moderate relief of symptoms. Per pt, he previously fractured his right ankle years ago and has had intermittent swelling and pain since that time. Pt is not currently followed by an orthopedist. Pt denies any numbness or weakness. He denies any other injury. Pt has no other acute complaints or associated symptoms at this time.   The history is provided by the patient. No language interpreter was used.    History reviewed. No pertinent past medical history.  Patient Active Problem List   Diagnosis Date Noted  . Anxiety and depression 05/31/2014  . Abdominal pain, epigastric 05/17/2014  . Chest pain at rest 04/12/2014  . Family history of prostate cancer in father 04/12/2014    No past surgical history on file.   Home Medications    Prior to Admission medications   Medication Sig Start Date End Date Taking? Authorizing Provider  cephALEXin (KEFLEX) 500 MG capsule Take 1 capsule (500 mg total) by mouth 4 (four) times daily. 06/10/15   Horton, Mayer Maskerourtney F, MD  hydrocortisone cream 1 % Apply to affected area 2 times daily 06/10/15   Horton, Mayer Maskerourtney F, MD  ibuprofen (ADVIL,MOTRIN) 600 MG  tablet Take 1 tablet (600 mg total) by mouth every 6 (six) hours as needed. 12/07/16   Ward, Chase PicketJaime Pilcher, PA-C  omeprazole (PRILOSEC) 40 MG capsule Take 1 capsule (40 mg total) by mouth daily. Patient not taking: Reported on 06/10/2015 05/31/14   Wall, Jesse Sanshomas C, MD  sertraline (ZOLOFT) 50 MG tablet Take 1 tablet (50 mg total) by mouth daily. Patient not taking: Reported on 06/10/2015 07/19/14   Daleen SquibbWall, Jesse Sanshomas C, MD    Family History Family History  Problem Relation Age of Onset  . Prostate cancer Father     Social History Social History  Substance Use Topics  . Smoking status: Current Every Day Smoker    Types: Cigars  . Smokeless tobacco: Never Used  . Alcohol use 0.0 oz/week     Comment: occ     Allergies   Patient has no known allergies.   Review of Systems Review of Systems  Musculoskeletal: Positive for arthralgias, joint swelling and myalgias.  Skin: Negative for wound.  Neurological: Negative for weakness and numbness.    Physical Exam Updated Vital Signs BP 131/82 (BP Location: Right Arm)   Pulse 71   Temp 97.9 F (36.6 C) (Oral)   Resp 16   Ht 5\' 9"  (1.753 m)   Wt 74.8 kg (165 lb)   SpO2 98%   BMI 24.37 kg/m   Physical Exam  Constitutional: He appears well-developed and well-nourished. No distress.  HENT:  Head: Normocephalic and atraumatic.  Neck: Neck supple.  Cardiovascular: Normal rate, regular  rhythm and normal heart sounds.   No murmur heard. Pulmonary/Chest: Effort normal and breath sounds normal. No respiratory distress. He has no wheezes. He has no rales.  Musculoskeletal: Normal range of motion.  Right ankle with tenderness to palpation around lateral malleolus.+ swelling.  Full ROM. No erythema, ecchymosis, or deformity appreciated. No break in skin. No pain to fifth metatarsal area or navicular region. Achilles intact. Good pedal pulse and cap refill of toes.Sensation grossly intact.  Neurological: He is alert.  Skin: Skin is warm and dry.    Nursing note and vitals reviewed.  ED Treatments / Results  DIAGNOSTIC STUDIES:  Oxygen Saturation is 98% on RA, normal by my interpretation.    COORDINATION OF CARE:  8:10 PM Discussed treatment plan with pt at bedside and pt agreed to plan.   Labs (all labs ordered are listed, but only abnormal results are displayed) Labs Reviewed - No data to display  EKG  EKG Interpretation None       Radiology Dg Ankle Complete Right  Result Date: 12/07/2016 CLINICAL DATA:  Patient reports injuring his right ankle twice in the last few months, reports twisting his ankle inward, patient reports lateral right ankle pain. EXAM: RIGHT ANKLE - COMPLETE 3+ VIEW COMPARISON:  None. FINDINGS: Slight cortical irregularity with minimal osseous fragment anterior to the distal tibia. This may indicate an avulsion injury. No displaced fractures are demonstrated. No focal bone lesion or bone destruction. Soft tissues are unremarkable. IMPRESSION: Probable avulsion fracture of the anterior distal tibia. Electronically Signed   By: Burman NievesWilliam  Stevens M.D.   On: 12/07/2016 21:10    Procedures Procedures (including critical care time)  Medications Ordered in ED Medications - No data to display   Initial Impression / Assessment and Plan / ED Course  I have reviewed the triage vital signs and the nursing notes.  Pertinent labs & imaging results that were available during my care of the patient were reviewed by me and considered in my medical decision making (see chart for details).    Bruce Steele is a 46 y.o. male who presents to ED for evaluation after jumping into a shallow pool causing ankle inversion. NVI on exam.   X-ray reviewed-  IMPRESSION: Probable avulsion fracture of the anterior distal tibia.  Results discussed with patient. He has boot with him therefore will use that for support. Crutches provided. Symptomatic home care instructions discussed. PCP or ortho follow up if symptoms  persist. All questions answered.     Final Clinical Impressions(s) / ED Diagnoses   Final diagnoses:  Closed avulsion fracture of right ankle, initial encounter    New Prescriptions New Prescriptions   IBUPROFEN (ADVIL,MOTRIN) 600 MG TABLET    Take 1 tablet (600 mg total) by mouth every 6 (six) hours as needed.   I personally performed the services described in this documentation, which was scribed in my presence. The recorded information has been reviewed and is accurate.    Ward, Chase PicketJaime Pilcher, PA-C 12/07/16 2158    Rolan BuccoBelfi, Melanie, MD 12/14/16 0700

## 2019-08-01 ENCOUNTER — Emergency Department (HOSPITAL_COMMUNITY)
Admission: EM | Admit: 2019-08-01 | Discharge: 2019-08-01 | Disposition: A | Discharge status: Home / Self Care | Payer: Self-pay | Attending: Emergency Medicine | Admitting: Emergency Medicine

## 2019-08-01 ENCOUNTER — Other Ambulatory Visit: Payer: Self-pay

## 2019-08-01 ENCOUNTER — Emergency Department (HOSPITAL_COMMUNITY): Payer: Self-pay

## 2019-08-01 ENCOUNTER — Encounter (HOSPITAL_COMMUNITY): Payer: Self-pay | Admitting: Emergency Medicine

## 2019-08-01 DIAGNOSIS — M5441 Lumbago with sciatica, right side: Secondary | ICD-10-CM | POA: Insufficient documentation

## 2019-08-01 DIAGNOSIS — F17290 Nicotine dependence, other tobacco product, uncomplicated: Secondary | ICD-10-CM | POA: Insufficient documentation

## 2019-08-01 DIAGNOSIS — M5442 Lumbago with sciatica, left side: Secondary | ICD-10-CM | POA: Insufficient documentation

## 2019-08-01 MED ORDER — CYCLOBENZAPRINE HCL 10 MG PO TABS: 10.0000 mg | ORAL_TABLET | Freq: Every evening | ORAL | 0 refills | Status: AC | PRN

## 2019-08-01 MED ORDER — IBUPROFEN 400 MG PO TABS
600.0000 mg | ORAL_TABLET | Freq: Once | ORAL | Status: AC
  Administered 2019-08-01: 600 mg via ORAL
  Filled 2019-08-01: qty 1

## 2019-08-01 MED ORDER — IBUPROFEN 600 MG PO TABS: 600.0000 mg | ORAL_TABLET | Freq: Three times a day (TID) | ORAL | 0 refills | Status: AC

## 2019-08-01 MED ORDER — LIDOCAINE 5 % EX PTCH
1.0000 | MEDICATED_PATCH | CUTANEOUS | Status: DC
  Administered 2019-08-01: 1 via TRANSDERMAL
  Filled 2019-08-01: qty 1

## 2019-08-01 NOTE — Discharge Instructions (Signed)
Take Ibuprofen 600mg  every 6-8 hours for pain and inflammation. Take with food Take Flexeril at bedtime to help you sleep. This medicine makes you drowsy so do not take before driving or work Use a heating pad for sore muscles - use for 20 minutes several times a day Try gentle range of motion exercises Return for worsening symptoms

## 2019-08-01 NOTE — ED Triage Notes (Signed)
Lower middle back pain since Friday   No injury , hurts down back of legs

## 2019-08-01 NOTE — ED Provider Notes (Signed)
[facility] EMERGENCY DEPARTMENT Provider Note   CSN: [id number] Arrival date & time: 08/01/19  1457   History Chief Complaint  Patient presents with  . Back Pain    Bruce Steele is a 49 y.o. male who presents with back pain. He states that on Friday morning he woke up with low back pain. The pain improved somewhat throughout the day and he went to work that evening. He works as a Editor, commissioning. When he came home he had difficulty sleeping due to pain. On Saturday he had pain throughout the day and had to call out or work and he called out of work last night. The pain is worse when he lies down and sits and better when he's moving around. It is aching and radiates down both legs to the knees at times. He has never had this before. No fever, syncope, trauma, unexplained weight loss, hx of cancer, loss of bowel/bladder function, saddle anesthesia, urinary retention, IVDU. He has not taken anything for pain.  HPI     History reviewed. No pertinent past medical history.  Patient Active Problem List   Diagnosis Date Noted  . Anxiety and depression 05/31/2014  . Abdominal pain, epigastric 05/17/2014  . Chest pain at rest 04/12/2014  . Family history of prostate cancer in father 04/12/2014    History reviewed. No pertinent surgical history.     Family History  Problem Relation Age of Onset  . Prostate cancer Father     Social History   Tobacco Use  . Smoking status: Current Every Day Smoker    Types: Cigars  . Smokeless tobacco: Never Used  Substance Use Topics  . Alcohol use: Yes    Alcohol/week: 0.0 standard drinks    Comment: occ  . Drug use: Yes    Types: Marijuana    Home Medications Prior to Admission medications   Medication Sig Start Date End Date Taking? Authorizing Provider  cephALEXin (KEFLEX) 500 MG capsule Take 1 capsule (500 mg total) by mouth 4 (four) times daily. 06/10/15   Horton, Barbette Hair, MD  hydrocortisone cream 1 %  Apply to affected area 2 times daily 06/10/15   Horton, Barbette Hair, MD  ibuprofen (ADVIL,MOTRIN) 600 MG tablet Take 1 tablet (600 mg total) by mouth every 6 (six) hours as needed. 12/07/16   Ward, Ozella Almond, PA-C  omeprazole (PRILOSEC) 40 MG capsule Take 1 capsule (40 mg total) by mouth daily. Patient not taking: Reported on 06/10/2015 05/31/14   Wall, Marijo Conception, MD  sertraline (ZOLOFT) 50 MG tablet Take 1 tablet (50 mg total) by mouth daily. Patient not taking: Reported on 06/10/2015 07/19/14   Verl Blalock, Marijo Conception, MD    Allergies    Patient has no known allergies.  Review of Systems   Review of Systems  Constitutional: Negative for fever.  Musculoskeletal: Positive for back pain.  Neurological: Negative for weakness and numbness.    Physical Exam Updated Vital Signs BP 130/77 (BP Location: Right Arm)   Pulse 84   Temp 97.9 F (36.6 C) (Oral)   Resp 18   Ht 5\' 9"  (1.753 m)   Wt 72.6 kg   SpO2 98%   BMI 23.63 kg/m   Physical Exam Vitals and nursing note reviewed.  Constitutional:      General: He is not in acute distress.    Appearance: Normal appearance. He is well-developed. He is not ill-appearing.  HENT:     Head: Normocephalic and atraumatic.  Eyes:  General: No scleral icterus.       Right eye: No discharge.        Left eye: No discharge.     Conjunctiva/sclera: Conjunctivae normal.     Pupils: Pupils are equal, round, and reactive to light.  Cardiovascular:     Rate and Rhythm: Normal rate.  Pulmonary:     Effort: Pulmonary effort is normal. No respiratory distress.  Abdominal:     General: There is no distension.  Musculoskeletal:     Cervical back: Normal range of motion.     Comments: Back: Inspection: No masses, deformity, or rash Palpation: Diffuse, mild low back tenderness Strength: 5/5 in lower extremities and normal plantar and dorsiflexion Sensation: Intact sensation with light touch in lower extremities bilaterally Reflexes: Patellar reflex is 2+  bilaterally SLR: Negative seated straight leg raise Gait: Normal gait   Skin:    General: Skin is warm and dry.  Neurological:     Mental Status: He is alert and oriented to person, place, and time.  Psychiatric:        Behavior: Behavior normal.     ED Results / Procedures / Treatments   Labs (all labs ordered are listed, but only abnormal results are displayed) Labs Reviewed - No data to display  EKG None  Radiology DG Lumbar Spine Complete  Result Date: 08/01/2019 CLINICAL DATA:  Atraumatic lower back pain for 3 days EXAM: LUMBAR SPINE - COMPLETE 4+ VIEW COMPARISON:  None. FINDINGS: Frontal, bilateral oblique, lateral views of the lumbar spine are obtained. Five non-rib-bearing lumbar type vertebral bodies are in anatomic alignment. There is sacralization of the L5 vertebral body bilaterally. Disc spaces are well preserved. Mild facet hypertrophy at the lumbosacral junction. Sacroiliac joints are normal. IMPRESSION: 1. No acute bony abnormality. 2. Mild lower lumbar facet hypertrophy.  No significant spondylosis. Electronically Signed   By: Sharlet Salina M.D.   On: 08/01/2019 17:42    Procedures Procedures (including critical care time)  Medications Ordered in ED Medications  lidocaine (LIDODERM) 5 % 1 patch (1 patch Transdermal Patch Applied 08/01/19 1742)  ibuprofen (ADVIL) tablet 600 mg (600 mg Oral Given 08/01/19 1742)    ED Course  I have reviewed the triage vital signs and the nursing notes.  Pertinent labs & imaging results that were available during my care of the patient were reviewed by me and considered in my medical decision making (see chart for details).  49 year old male with new onset atraumatic low back pain for 2-3 days. Vitals are normal. He is well appearing and strength is intact. He has normal reflexes and is ambulatory. No red flags in history or on exam. Low suspicion for cauda equina, epidural abscess, or other emergent cause of back pain. Xray  obtained mild lower lumbar facet arthropathy and sacralization of the L5 vertebral body bilaterally. Do not feel MRI is indicated currently.   Discussed with patient. Advised trial of NSAIDs and muscle relaxer. Recommended f/u with sports medicine or chiropractor.   MDM Rules/Calculators/A&P                       Final Clinical Impression(s) / ED Diagnoses Final diagnoses:  Acute bilateral low back pain with bilateral sciatica    Rx / DC Orders ED Discharge Orders    None       Beryle Quant 08/01/19 2025    Benjiman Core, MD 08/01/19 2337

## 2019-08-01 NOTE — ED Notes (Addendum)
Taken to XRAY

## 2021-12-23 ENCOUNTER — Emergency Department (HOSPITAL_COMMUNITY)
Admission: EM | Admit: 2021-12-23 | Discharge: 2021-12-23 | Disposition: A | Discharge status: Home / Self Care | Payer: 59 | Attending: Emergency Medicine | Admitting: Emergency Medicine

## 2021-12-23 ENCOUNTER — Encounter (HOSPITAL_COMMUNITY): Payer: Self-pay | Admitting: *Deleted

## 2021-12-23 DIAGNOSIS — R220 Localized swelling, mass and lump, head: Secondary | ICD-10-CM | POA: Insufficient documentation

## 2021-12-23 DIAGNOSIS — K0889 Other specified disorders of teeth and supporting structures: Secondary | ICD-10-CM

## 2021-12-23 MED ORDER — PENICILLIN V POTASSIUM 500 MG PO TABS
500.0000 mg | ORAL_TABLET | Freq: Four times a day (QID) | ORAL | 0 refills | Status: AC
Start: 2021-12-23 — End: ?

## 2021-12-23 MED ORDER — PENICILLIN V POTASSIUM 500 MG PO TABS: 500.0000 mg | ORAL_TABLET | Freq: Four times a day (QID) | ORAL | 0 refills | Status: DC

## 2021-12-23 MED ORDER — IBUPROFEN 200 MG PO TABS
600.0000 mg | ORAL_TABLET | Freq: Once | ORAL | Status: AC
  Administered 2021-12-23: 600 mg via ORAL
  Filled 2021-12-23: qty 1

## 2021-12-23 MED ORDER — PENICILLIN V POTASSIUM 250 MG PO TABS
500.0000 mg | ORAL_TABLET | Freq: Once | ORAL | Status: AC
  Administered 2021-12-23: 500 mg via ORAL
  Filled 2021-12-23: qty 2

## 2021-12-23 MED ORDER — HYDROCODONE-ACETAMINOPHEN 5-325 MG PO TABS
1.0000 | ORAL_TABLET | Freq: Once | ORAL | Status: AC
  Administered 2021-12-23: 1 via ORAL
  Filled 2021-12-23: qty 1

## 2021-12-23 NOTE — ED Provider Notes (Signed)
  [facility]-EMERGENCY DEPT [facility] Emergency Department Provider Note MRN:  [id number]  Arrival date & time: 12/23/21     Chief Complaint   Facial Swelling   History of Present Illness   Bruce Steele is a 51 y.o. year-old male presents to the ED with chief complaint of dental pain and facial swelling.  He reports having symptoms intermittently for quite a while.  Reports worsening symptoms over the past couple of days.  Now having facial swelling.  States that he has fatigue.  Reports subjective fevers and chills.  History provided by patient.   Review of Systems  Pertinent positive and negative review of systems noted in HPI.    Physical Exam   Vitals:   12/23/21 2236  BP: (!) 141/81  Pulse: 87  Resp: 16  Temp: 99.7 F (37.6 C)  SpO2: 100%    CONSTITUTIONAL:  well-appearing, NAD NEURO:  Alert and oriented x 3, CN 3-12 grossly intact EYES:  eyes equal and reactive ENT/NECK:  Supple, no stridor, poor dentition, no sublingual tenderness, no obvious drainable abscess  CARDIO:  appears well-perfused  PULM:  No respiratory distress,  GI/GU:  non-distended,  MSK/SPINE:  No gross deformities, no edema, moves all extremities  SKIN:  no rash, atraumatic   *Additional and/or pertinent findings included in MDM below  Diagnostic and Interventional Summary    Labs Reviewed - No data to display  No orders to display    Medications  HYDROcodone-acetaminophen (NORCO/VICODIN) 5-325 MG per tablet 1 tablet (has no administration in time range)  ibuprofen (ADVIL) tablet 600 mg (has no administration in time range)  penicillin v potassium (VEETID) tablet 500 mg (has no administration in time range)     Procedures  /  Critical Care Procedures  ED Course and Medical Decision Making  I have reviewed the triage vital signs, the nursing notes, and pertinent available records from the EMR.  Social Determinants Affecting Complexity of Care: Patient has no clinically  significant social determinants affecting this chief complaint..   ED Course:    Medical Decision Making Patient with dentalgia.  No abscess requiring immediate incision and drainage.  Exam not concerning for Ludwig's angina or pharyngeal abscess.  Will treat with penicillin. Pt instructed to follow-up with dentist.  Discussed return precautions. Pt safe for discharge.   Risk Prescription drug management.     Consultants: No consultations were needed in caring for this patient.   Treatment and Plan: Emergency department workup does not suggest an emergent condition requiring admission or immediate intervention beyond  what has been performed at this time. The patient is safe for discharge and has  been instructed to return immediately for worsening symptoms, change in  symptoms or any other concerns    Final Clinical Impressions(s) / ED Diagnoses     ICD-10-CM   1. Pain, dental  K08.89       ED Discharge Orders          Ordered    penicillin v potassium (VEETID) 500 MG tablet  4 times daily        12/23/21 2244              Discharge Instructions Discussed with and Provided to Patient:   Discharge Instructions   None      Roxy Horseman, PA-C 12/23/21 2249    Blane Ohara, MD 12/23/21 2333
